# Patient Record
Sex: Female | Born: 1955 | Race: White | Hispanic: No | Smoking: Light tobacco smoker
Health system: Southern US, Community
[De-identification: ages and names within clinical notes are randomized; demographics above are authoritative.]

## PROBLEM LIST (undated history)

## (undated) DIAGNOSIS — E785 Hyperlipidemia, unspecified: Secondary | ICD-10-CM

## (undated) DIAGNOSIS — F419 Anxiety disorder, unspecified: Secondary | ICD-10-CM

## (undated) DIAGNOSIS — R7309 Other abnormal glucose: Secondary | ICD-10-CM

## (undated) DIAGNOSIS — O009 Unspecified ectopic pregnancy without intrauterine pregnancy: Secondary | ICD-10-CM

## (undated) DIAGNOSIS — C539 Malignant neoplasm of cervix uteri, unspecified: Secondary | ICD-10-CM

## (undated) DIAGNOSIS — L301 Dyshidrosis [pompholyx]: Secondary | ICD-10-CM

## (undated) DIAGNOSIS — Z720 Tobacco use: Secondary | ICD-10-CM

## (undated) DIAGNOSIS — M1990 Unspecified osteoarthritis, unspecified site: Secondary | ICD-10-CM

## (undated) DIAGNOSIS — I10 Essential (primary) hypertension: Secondary | ICD-10-CM

## (undated) DIAGNOSIS — L309 Dermatitis, unspecified: Secondary | ICD-10-CM

## (undated) DIAGNOSIS — IMO0002 Reserved for concepts with insufficient information to code with codable children: Secondary | ICD-10-CM

## (undated) HISTORY — DX: Essential (primary) hypertension: I10

## (undated) HISTORY — DX: Tobacco use: Z72.0

## (undated) HISTORY — DX: Unspecified osteoarthritis, unspecified site: M19.90

## (undated) HISTORY — DX: Reserved for concepts with insufficient information to code with codable children: IMO0002

## (undated) HISTORY — DX: Anxiety disorder, unspecified: F41.9

## (undated) HISTORY — DX: Dermatitis, unspecified: L30.9

## (undated) HISTORY — DX: Dyshidrosis (pompholyx): L30.1

## (undated) HISTORY — DX: Other abnormal glucose: R73.09

## (undated) HISTORY — DX: Hyperlipidemia, unspecified: E78.5

---

## 2003-03-30 ENCOUNTER — Emergency Department (HOSPITAL_COMMUNITY): Admission: AD | Admit: 2003-03-30 | Discharge: 2003-03-30 | Payer: Self-pay | Admitting: Internal Medicine

## 2005-02-08 ENCOUNTER — Emergency Department (HOSPITAL_COMMUNITY): Admission: EM | Admit: 2005-02-08 | Discharge: 2005-02-08 | Payer: Self-pay | Admitting: Emergency Medicine

## 2009-11-08 ENCOUNTER — Emergency Department (HOSPITAL_COMMUNITY): Admission: EM | Admit: 2009-11-08 | Discharge: 2009-11-08 | Payer: Self-pay | Admitting: Emergency Medicine

## 2009-12-04 ENCOUNTER — Encounter: Payer: Self-pay | Admitting: Licensed Clinical Social Worker

## 2009-12-04 ENCOUNTER — Ambulatory Visit: Payer: Self-pay | Admitting: Internal Medicine

## 2009-12-04 ENCOUNTER — Encounter: Payer: Self-pay | Admitting: Internal Medicine

## 2009-12-04 DIAGNOSIS — F17200 Nicotine dependence, unspecified, uncomplicated: Secondary | ICD-10-CM | POA: Insufficient documentation

## 2009-12-04 DIAGNOSIS — F419 Anxiety disorder, unspecified: Secondary | ICD-10-CM | POA: Insufficient documentation

## 2009-12-04 DIAGNOSIS — R7309 Other abnormal glucose: Secondary | ICD-10-CM

## 2009-12-04 DIAGNOSIS — T7491XA Unspecified adult maltreatment, confirmed, initial encounter: Secondary | ICD-10-CM | POA: Insufficient documentation

## 2009-12-04 DIAGNOSIS — L259 Unspecified contact dermatitis, unspecified cause: Secondary | ICD-10-CM | POA: Insufficient documentation

## 2009-12-04 DIAGNOSIS — L301 Dyshidrosis [pompholyx]: Secondary | ICD-10-CM

## 2009-12-04 LAB — CONVERTED CEMR LAB: Hgb A1c MFr Bld: 5.8 %

## 2009-12-05 ENCOUNTER — Telehealth: Payer: Self-pay | Admitting: Internal Medicine

## 2010-05-26 NOTE — Progress Notes (Signed)
Summary: med change/gp  Phone Note Refill Request Message from:  Fax from Pharmacy on December 05, 2009 11:59 AM  Refills Requested: Medication #1:  TRIAMCINOLONE ACETONIDE 0.1 % LOTN apply to affected areas of hands and feet twice daily Lotion is not available; can it be change to cream or lotion.  Need new Rx.  Thanks   Method Requested: Electronic Initial call taken by: Chinita Pester RN,  December 05, 2009 12:00 PM  Follow-up for Phone Call        done Follow-up by: Darnelle Maffucci MD,  December 05, 2009 1:45 PM  Additional Follow-up for Phone Call Additional follow up Details #1::        Dr. Gilford Rile can u sign the Rx.  Thanks Additional Follow-up by: Chinita Pester RN,  December 05, 2009 1:48 PM    New/Updated Medications: TRIAMCINOLONE ACETONIDE 0.1 % CREA (TRIAMCINOLONE ACETONIDE) apply to affected areas twice daily. Prescriptions: TRIAMCINOLONE ACETONIDE 0.1 % CREA (TRIAMCINOLONE ACETONIDE) apply to affected areas twice daily.  #1 x 2   Entered and Authorized by:   Darnelle Maffucci MD   Signed by:   Darnelle Maffucci MD on 12/05/2009   Method used:   Electronically to        [facility] [phone]* (retail)       [address]       [place], [place]  [address]       Ph: [phone]       Fax: [phone]   RxID:   [id number] TRIAMCINOLONE ACETONIDE 0.1 % CREA (TRIAMCINOLONE ACETONIDE) apply to affected areas twice daily.  #1 x 2   Entered and Authorized by:   Darnelle Maffucci MD   Signed by:   Chinita Pester RN on 12/05/2009   Method used:   Electronically to        [facility] [phone]* (retail)       [address]       [place], [place]  [address]       Ph: [phone]       Fax: [phone]   RxID:   [id number]

## 2010-05-26 NOTE — Miscellaneous (Signed)
Summary: PATIENT CONSENT FORM  PATIENT CONSENT FORM   Imported By: Louretta Parma 12/04/2009 10:38:08  _____________________________________________________________________  External Attachment:    Type:   Image     Comment:   External Document
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
## 2010-07-11 LAB — URINALYSIS, ROUTINE W REFLEX MICROSCOPIC
Glucose, UA: NEGATIVE mg/dL
Hgb urine dipstick: NEGATIVE
Ketones, ur: NEGATIVE mg/dL
Nitrite: NEGATIVE
Protein, ur: NEGATIVE mg/dL
Specific Gravity, Urine: 1.028 (ref 1.005–1.030)
Urobilinogen, UA: 0.2 mg/dL (ref 0.0–1.0)
pH: 5.5 (ref 5.0–8.0)

## 2010-07-11 LAB — COMPREHENSIVE METABOLIC PANEL
ALT: 44 U/L — ABNORMAL HIGH (ref 0–35)
AST: 36 U/L (ref 0–37)
Albumin: 4.2 g/dL (ref 3.5–5.2)
Alkaline Phosphatase: 106 U/L (ref 39–117)
BUN: 11 mg/dL (ref 6–23)
CO2: 27 mEq/L (ref 19–32)
Calcium: 9.9 mg/dL (ref 8.4–10.5)
Chloride: 105 mEq/L (ref 96–112)
Creatinine, Ser: 0.63 mg/dL (ref 0.4–1.2)
GFR calc Af Amer: >60 mL/min (ref >60)
GFR calc non Af Amer: >60 mL/min (ref >60)
Glucose, Bld: 131 mg/dL — ABNORMAL HIGH (ref 70–99)
Potassium: 4 mEq/L (ref 3.5–5.1)
Sodium: 141 mEq/L (ref 135–145)
Total Bilirubin: 0.7 mg/dL (ref 0.3–1.2)
Total Protein: 7.4 g/dL (ref 6.0–8.3)

## 2010-07-11 LAB — CBC
HCT: 46.9 % — ABNORMAL HIGH (ref 36.0–46.0)
Hemoglobin: 16.4 g/dL — ABNORMAL HIGH (ref 12.0–15.0)
MCH: 31.5 pg (ref 26.0–34.0)
MCHC: 34.8 g/dL (ref 30.0–36.0)
MCV: 90.6 fL (ref 78.0–100.0)
Platelets: 240 10*3/uL (ref 150–400)
RBC: 5.18 MIL/uL — ABNORMAL HIGH (ref 3.87–5.11)
RDW: 13.1 % (ref 11.5–15.5)
WBC: 9.1 10*3/uL (ref 4.0–10.5)

## 2010-07-11 LAB — TSH: TSH: 2 u[IU]/mL (ref 0.350–4.500)

## 2010-07-11 LAB — DIFFERENTIAL
Basophils Absolute: 0.1 10*3/uL (ref 0.0–0.1)
Basophils Relative: 2 % — ABNORMAL HIGH (ref 0–1)
Eosinophils Absolute: 0.3 10*3/uL (ref 0.0–0.7)
Eosinophils Relative: 3 % (ref 0–5)
Lymphocytes Relative: 29 % (ref 12–46)
Lymphs Abs: 2.6 10*3/uL (ref 0.7–4.0)
Monocytes Absolute: 0.6 10*3/uL (ref 0.1–1.0)
Monocytes Relative: 6 % (ref 3–12)
Neutro Abs: 5.5 10*3/uL (ref 1.7–7.7)
Neutrophils Relative %: 61 % (ref 43–77)

## 2010-07-11 LAB — URINE MICROSCOPIC-ADD ON

## 2010-07-23 ENCOUNTER — Encounter: Payer: Self-pay | Admitting: Internal Medicine

## 2010-12-11 ENCOUNTER — Inpatient Hospital Stay (INDEPENDENT_AMBULATORY_CARE_PROVIDER_SITE_OTHER)
Admission: RE | Admit: 2010-12-11 | Discharge: 2010-12-11 | Disposition: A | Discharge status: Home / Self Care | Payer: Self-pay | Source: Ambulatory Visit | Attending: Emergency Medicine | Admitting: Emergency Medicine

## 2010-12-11 DIAGNOSIS — L408 Other psoriasis: Secondary | ICD-10-CM

## 2010-12-13 LAB — WOUND CULTURE
Culture: NO GROWTH
Gram Stain: NONE SEEN

## 2010-12-14 LAB — HERPES SIMPLEX VIRUS CULTURE: Culture: NOT DETECTED

## 2013-01-23 ENCOUNTER — Encounter (HOSPITAL_COMMUNITY): Payer: Self-pay | Admitting: Emergency Medicine

## 2013-01-23 ENCOUNTER — Emergency Department (HOSPITAL_COMMUNITY)
Admission: EM | Admit: 2013-01-23 | Discharge: 2013-01-24 | Disposition: A | Discharge status: Home / Self Care | Payer: Self-pay | Attending: Emergency Medicine | Admitting: Emergency Medicine

## 2013-01-23 ENCOUNTER — Emergency Department (HOSPITAL_COMMUNITY): Payer: Self-pay

## 2013-01-23 DIAGNOSIS — IMO0002 Reserved for concepts with insufficient information to code with codable children: Secondary | ICD-10-CM | POA: Insufficient documentation

## 2013-01-23 DIAGNOSIS — F17200 Nicotine dependence, unspecified, uncomplicated: Secondary | ICD-10-CM | POA: Insufficient documentation

## 2013-01-23 DIAGNOSIS — Z872 Personal history of diseases of the skin and subcutaneous tissue: Secondary | ICD-10-CM | POA: Insufficient documentation

## 2013-01-23 DIAGNOSIS — J3489 Other specified disorders of nose and nasal sinuses: Secondary | ICD-10-CM | POA: Insufficient documentation

## 2013-01-23 DIAGNOSIS — Z8541 Personal history of malignant neoplasm of cervix uteri: Secondary | ICD-10-CM | POA: Insufficient documentation

## 2013-01-23 DIAGNOSIS — Z862 Personal history of diseases of the blood and blood-forming organs and certain disorders involving the immune mechanism: Secondary | ICD-10-CM | POA: Insufficient documentation

## 2013-01-23 DIAGNOSIS — J069 Acute upper respiratory infection, unspecified: Secondary | ICD-10-CM | POA: Insufficient documentation

## 2013-01-23 DIAGNOSIS — R61 Generalized hyperhidrosis: Secondary | ICD-10-CM | POA: Insufficient documentation

## 2013-01-23 DIAGNOSIS — Z8639 Personal history of other endocrine, nutritional and metabolic disease: Secondary | ICD-10-CM | POA: Insufficient documentation

## 2013-01-23 DIAGNOSIS — Z8659 Personal history of other mental and behavioral disorders: Secondary | ICD-10-CM | POA: Insufficient documentation

## 2013-01-23 DIAGNOSIS — Z8742 Personal history of other diseases of the female genital tract: Secondary | ICD-10-CM | POA: Insufficient documentation

## 2013-01-23 HISTORY — DX: Unspecified ectopic pregnancy without intrauterine pregnancy: O00.90

## 2013-01-23 HISTORY — DX: Malignant neoplasm of cervix uteri, unspecified: C53.9

## 2013-01-23 MED ORDER — ALBUTEROL SULFATE (5 MG/ML) 0.5% IN NEBU
5.0000 mg | INHALATION_SOLUTION | Freq: Once | RESPIRATORY_TRACT | Status: AC
  Administered 2013-01-23: 5 mg via RESPIRATORY_TRACT
  Filled 2013-01-23 (×2): qty 1

## 2013-01-23 NOTE — ED Notes (Signed)
NP at bedside.

## 2013-01-23 NOTE — ED Notes (Signed)
Pt. reports productive cough with nasal / chest  congestion  for several weeks unrelieved by OTC medications .

## 2013-01-23 NOTE — ED Notes (Signed)
RT at bedside.
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
## 2013-01-23 NOTE — ED Notes (Signed)
Pt. C/o productive cough x2 weeks. States that when she lays down to sleep "my throat closes up and I can't breathe".

## 2013-01-24 MED ORDER — GUAIFENESIN-CODEINE 100-10 MG/5ML PO SOLN: 5.0000 mL | Freq: Three times a day (TID) | ORAL | Status: DC | PRN

## 2013-01-24 MED ORDER — ALBUTEROL SULFATE HFA 108 (90 BASE) MCG/ACT IN AERS
2.0000 | INHALATION_SPRAY | RESPIRATORY_TRACT | Status: DC | PRN
  Administered 2013-01-24: 2 via RESPIRATORY_TRACT
  Filled 2013-01-24: qty 6.7

## 2013-01-24 MED ORDER — AEROCHAMBER PLUS W/MASK MISC
Status: AC
  Administered 2013-01-24
  Filled 2013-01-24: qty 1

## 2013-01-26 NOTE — ED Provider Notes (Signed)
Medical screening examination/treatment/procedure(s) were performed by non-physician practitioner and as supervising physician I was immediately available for consultation/collaboration.  Lynetta Tomczak T Isaac Lacson, MD 01/26/13 0709 

## 2013-05-23 ENCOUNTER — Emergency Department (INDEPENDENT_AMBULATORY_CARE_PROVIDER_SITE_OTHER): Payer: No Typology Code available for payment source

## 2013-05-23 ENCOUNTER — Encounter (HOSPITAL_COMMUNITY): Payer: Self-pay | Admitting: Emergency Medicine

## 2013-05-23 ENCOUNTER — Emergency Department (INDEPENDENT_AMBULATORY_CARE_PROVIDER_SITE_OTHER)
Admission: EM | Admit: 2013-05-23 | Discharge: 2013-05-23 | Disposition: A | Discharge status: Home / Self Care | Payer: No Typology Code available for payment source | Source: Home / Self Care | Attending: Family Medicine | Admitting: Family Medicine

## 2013-05-23 DIAGNOSIS — M7580 Other shoulder lesions, unspecified shoulder: Secondary | ICD-10-CM

## 2013-05-23 DIAGNOSIS — M5030 Other cervical disc degeneration, unspecified cervical region: Secondary | ICD-10-CM

## 2013-05-23 DIAGNOSIS — M25819 Other specified joint disorders, unspecified shoulder: Secondary | ICD-10-CM

## 2013-05-23 DIAGNOSIS — M7542 Impingement syndrome of left shoulder: Secondary | ICD-10-CM

## 2013-05-23 MED ORDER — TRAMADOL HCL 50 MG PO TABS: 50.0000 mg | ORAL_TABLET | Freq: Four times a day (QID) | ORAL | Status: DC | PRN

## 2013-05-23 MED ORDER — PREDNISONE 10 MG PO KIT: PACK | ORAL | Status: DC

## 2013-05-23 NOTE — ED Notes (Signed)
C/o left arm pain States she is unable to sleep, lift arm, and unable to turn head  States pain radiates to neck She thinks it could be arthritis but unsure
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
## 2013-05-23 NOTE — Discharge Instructions (Signed)
Thank you for coming in today. Take prednisone daily for 12 days. Use tramadol for severe pain. Please get your orange card. Followup at the [facility] [address] [place], [place] [address] [phone]  Please see Bonna Gains to qualify for reduced or free medical services within the [place].  Call her at [phone] today.  Do the shoulder exercises we talked about.  Come back or go to the emergency room if you notice new weakness new numbness problems walking or bowel or bladder problems.

## 2013-05-25 ENCOUNTER — Emergency Department (INDEPENDENT_AMBULATORY_CARE_PROVIDER_SITE_OTHER)
Admission: EM | Admit: 2013-05-25 | Discharge: 2013-05-25 | Disposition: A | Discharge status: Home / Self Care | Payer: No Typology Code available for payment source | Source: Home / Self Care | Attending: Family Medicine | Admitting: Family Medicine

## 2013-05-25 ENCOUNTER — Encounter (HOSPITAL_COMMUNITY): Payer: Self-pay | Admitting: Emergency Medicine

## 2013-05-25 DIAGNOSIS — M25819 Other specified joint disorders, unspecified shoulder: Secondary | ICD-10-CM

## 2013-05-25 DIAGNOSIS — M7542 Impingement syndrome of left shoulder: Secondary | ICD-10-CM

## 2013-05-25 DIAGNOSIS — M5030 Other cervical disc degeneration, unspecified cervical region: Secondary | ICD-10-CM

## 2013-05-25 DIAGNOSIS — M7580 Other shoulder lesions, unspecified shoulder: Secondary | ICD-10-CM

## 2013-05-25 MED ORDER — DICLOFENAC SODIUM 1 % TD GEL: TRANSDERMAL | Status: DC

## 2013-05-25 NOTE — Discharge Instructions (Signed)
You can try voltaren (diclofenac) gel for your shoulder pain We will put your shoulder in a sling that should help take pressure off the shoulder. Make sure to fill the prescription for prednisone if you are at all able.  Followup at the [facility]  [address] [place], [place] [address]  [phone]
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
## 2013-05-25 NOTE — ED Notes (Signed)
Patient has left shoulder pain, same pain seen for 1/28.  Patient reports the tramadol makes he have headaches, sweats, constipation, and heartburn.  Patient did not get prednisone filled-too expensive.

## 2013-05-27 NOTE — ED Provider Notes (Signed)
Medical screening examination/treatment/procedure(s) were performed by resident physician or non-physician practitioner and as supervising physician I was immediately available for consultation/collaboration.   Pauline Good MD.   Billy Fischer, MD 05/27/13 [phone]

## 2013-06-25 ENCOUNTER — Emergency Department (INDEPENDENT_AMBULATORY_CARE_PROVIDER_SITE_OTHER)
Admission: EM | Admit: 2013-06-25 | Discharge: 2013-06-25 | Disposition: A | Discharge status: Home / Self Care | Payer: No Typology Code available for payment source | Source: Home / Self Care | Attending: Family Medicine | Admitting: Family Medicine

## 2013-06-25 ENCOUNTER — Encounter (HOSPITAL_COMMUNITY): Payer: Self-pay | Admitting: Emergency Medicine

## 2013-06-25 DIAGNOSIS — M7550 Bursitis of unspecified shoulder: Secondary | ICD-10-CM

## 2013-06-25 DIAGNOSIS — M25519 Pain in unspecified shoulder: Secondary | ICD-10-CM

## 2013-06-25 DIAGNOSIS — M75100 Unspecified rotator cuff tear or rupture of unspecified shoulder, not specified as traumatic: Secondary | ICD-10-CM

## 2013-06-25 DIAGNOSIS — IMO0002 Reserved for concepts with insufficient information to code with codable children: Secondary | ICD-10-CM

## 2013-06-25 MED ORDER — TRAMADOL HCL 50 MG PO TABS: 50.0000 mg | ORAL_TABLET | Freq: Four times a day (QID) | ORAL | Status: DC | PRN

## 2013-06-25 MED ORDER — METHYLPREDNISOLONE ACETATE 40 MG/ML IJ SUSP
20.0000 mg | Freq: Once | INTRAMUSCULAR | Status: AC
  Administered 2013-06-25: 20 mg via INTRA_ARTICULAR

## 2013-06-25 MED ORDER — METHYLPREDNISOLONE ACETATE 40 MG/ML IJ SUSP
INTRAMUSCULAR | Status: AC
  Filled 2013-06-25: qty 5

## 2013-06-25 NOTE — ED Notes (Signed)
Pt  Reports  l   Shoulder  Pain          For  Quite  A  While     Reports  Was  Seen  ucc  3  Weeks  Ago  For  Rotator  Cuff        She  Reports  Pain is  Worse  On  Certain movements  And  posistions
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
## 2013-06-25 NOTE — Discharge Instructions (Signed)
Impingement Syndrome, Rotator Cuff, Bursitis with Rehab Impingement syndrome is a condition that involves inflammation of the tendons of the rotator cuff and the subacromial bursa, that causes pain in the shoulder. The rotator cuff consists of four tendons and muscles that control much of the shoulder and upper arm function. The subacromial bursa is a fluid filled sac that helps reduce friction between the rotator cuff and one of the bones of the shoulder (acromion). Impingement syndrome is usually an overuse injury that causes swelling of the bursa (bursitis), swelling of the tendon (tendonitis), and/or a tear of the tendon (strain). Strains are classified into three categories. Grade 1 strains cause pain, but the tendon is not lengthened. Grade 2 strains include a lengthened ligament, due to the ligament being stretched or partially ruptured. With grade 2 strains there is still function, although the function may be decreased. Grade 3 strains include a complete tear of the tendon or muscle, and function is usually impaired. SYMPTOMS   Pain around the shoulder, often at the outer portion of the upper arm.  Pain that gets worse with shoulder function, especially when reaching overhead or lifting.  Sometimes, aching when not using the arm.  Pain that wakes you up at night.  Sometimes, tenderness, swelling, warmth, or redness over the affected area.  Loss of strength.  Limited motion of the shoulder, especially reaching behind the back (to the back pocket or to unhook bra) or across your body.  Crackling sound (crepitation) when moving the arm.  Biceps tendon pain and inflammation (in the front of the shoulder). Worse when bending the elbow or lifting. CAUSES  Impingement syndrome is often an overuse injury, in which chronic (repetitive) motions cause the tendons or bursa to become inflamed. A strain occurs when a force is paced on the tendon or muscle that is greater than it can withstand.  Common mechanisms of injury include: Stress from sudden increase in duration, frequency, or intensity of training.  Direct hit (trauma) to the shoulder.  Aging, erosion of the tendon with normal use.  Bony bump on shoulder (acromial spur). RISK INCREASES WITH:  Contact sports (football, wrestling, boxing).  Throwing sports (baseball, tennis, volleyball).  Weightlifting and bodybuilding.  Heavy labor.  Previous injury to the rotator cuff, including impingement.  Poor shoulder strength and flexibility.  Failure to warm up properly before activity.  Inadequate protective equipment.  Old age.  Bony bump on shoulder (acromial spur). PREVENTION   Warm up and stretch properly before activity.  Allow for adequate recovery between workouts.  Maintain physical fitness:  Strength, flexibility, and endurance.  Cardiovascular fitness.  Learn and use proper exercise technique. PROGNOSIS  If treated properly, impingement syndrome usually goes away within 6 weeks. Sometimes surgery is required.  RELATED COMPLICATIONS   Longer healing time if not properly treated, or if not given enough time to heal.  Recurring symptoms, that result in a chronic condition.  Shoulder stiffness, frozen shoulder, or loss of motion.  Rotator cuff tendon tear.  Recurring symptoms, especially if activity is resumed too soon, with overuse, with a direct blow, or when using poor technique. TREATMENT  Treatment first involves the use of ice and medicine, to reduce pain and inflammation. The use of strengthening and stretching exercises may help reduce pain with activity. These exercises may be performed at home or with a therapist. If non-surgical treatment is unsuccessful after more than 6 months, surgery may be advised. After surgery and rehabilitation, activity is usually possible in 3 months.  MEDICATION  If pain medicine is needed, nonsteroidal anti-inflammatory medicines (aspirin and  ibuprofen), or other minor pain relievers (acetaminophen), are often advised.  Do not take pain medicine for 7 days before surgery.  Prescription pain relievers may be given, if your caregiver thinks they are needed. Use only as directed and only as much as you need.  Corticosteroid injections may be given by your caregiver. These injections should be reserved for the most serious cases, because they may only be given a certain number of times. HEAT AND COLD  Cold treatment (icing) should be applied for 10 to 15 minutes every 2 to 3 hours for inflammation and pain, and immediately after activity that aggravates your symptoms. Use ice packs or an ice massage.  Heat treatment may be used before performing stretching and strengthening activities prescribed by your caregiver, physical therapist, or athletic trainer. Use a heat pack or a warm water soak. SEEK MEDICAL CARE IF:   Symptoms get worse or do not improve in 4 to 6 weeks, despite treatment.  New, unexplained symptoms develop. (Drugs used in treatment may produce side effects.) EXERCISES  RANGE OF MOTION (ROM) AND STRETCHING EXERCISES - Impingement Syndrome (Rotator Cuff  Tendinitis, Bursitis) These exercises may help you when beginning to rehabilitate your injury. Your symptoms may go away with or without further involvement from your physician, physical therapist or athletic trainer. While completing these exercises, remember:   Restoring tissue flexibility helps normal motion to return to the joints. This allows healthier, less painful movement and activity.  An effective stretch should be held for at least 30 seconds.  A stretch should never be painful. You should only feel a gentle lengthening or release in the stretched tissue. STRETCH  Flexion, Standing  Stand with good posture. With an underhand grip on your right / left hand, and an overhand grip on the opposite hand, grasp a broomstick or cane so that your hands are a little  more than shoulder width apart.  Keeping your right / left elbow straight and shoulder muscles relaxed, push the stick with your opposite hand, to raise your right / left arm in front of your body and then overhead. Raise your arm until you feel a stretch in your right / left shoulder, but before you have increased shoulder pain.  Try to avoid shrugging your right / left shoulder as your arm rises, by keeping your shoulder blade tucked down and toward your mid-back spine. Hold for __________ seconds.  Slowly return to the starting position. Repeat __________ times. Complete this exercise __________ times per day. STRETCH  Abduction, Supine  Lie on your back. With an underhand grip on your right / left hand and an overhand grip on the opposite hand, grasp a broomstick or cane so that your hands are a little more than shoulder width apart.  Keeping your right / left elbow straight and your shoulder muscles relaxed, push the stick with your opposite hand, to raise your right / left arm out to the side of your body and then overhead. Raise your arm until you feel a stretch in your right / left shoulder, but before you have increased shoulder pain.  Try to avoid shrugging your right / left shoulder as your arm rises, by keeping your shoulder blade tucked down and toward your mid-back spine. Hold for __________ seconds.  Slowly return to the starting position. Repeat __________ times. Complete this exercise __________ times per day. ROM  Flexion, Active-Assisted  Lie on your back.  You may bend your knees for comfort.  Grasp a broomstick or cane so your hands are about shoulder width apart. Your right / left hand should grip the end of the stick, so that your hand is positioned "thumbs-up," as if you were about to shake hands.  Using your healthy arm to lead, raise your right / left arm overhead, until you feel a gentle stretch in your shoulder. Hold for __________ seconds.  Use the stick to  assist in returning your right / left arm to its starting position. Repeat __________ times. Complete this exercise __________ times per day.  ROM - Internal Rotation, Supine   Lie on your back on a firm surface. Place your right / left elbow about 60 degrees away from your side. Elevate your elbow with a folded towel, so that the elbow and shoulder are the same height.  Using a broomstick or cane and your strong arm, pull your right / left hand toward your body until you feel a gentle stretch, but no increase in your shoulder pain. Keep your shoulder and elbow in place throughout the exercise.  Hold for __________ seconds. Slowly return to the starting position. Repeat __________ times. Complete this exercise __________ times per day. STRETCH - Internal Rotation  Place your right / left hand behind your back, palm up.  Throw a towel or belt over your opposite shoulder. Grasp the towel with your right / left hand.  While keeping an upright posture, gently pull up on the towel, until you feel a stretch in the front of your right / left shoulder.  Avoid shrugging your right / left shoulder as your arm rises, by keeping your shoulder blade tucked down and toward your mid-back spine.  Hold for __________ seconds. Release the stretch, by lowering your healthy hand. Repeat __________ times. Complete this exercise __________ times per day. ROM - Internal Rotation   Using an underhand grip, grasp a stick behind your back with both hands.  While standing upright with good posture, slide the stick up your back until you feel a mild stretch in the front of your shoulder.  Hold for __________ seconds. Slowly return to your starting position. Repeat __________ times. Complete this exercise __________ times per day.  STRETCH  Posterior Shoulder Capsule   Stand or sit with good posture. Grasp your right / left elbow and draw it across your chest, keeping it at the same height as your  shoulder.  Pull your elbow, so your upper arm comes in closer to your chest. Pull until you feel a gentle stretch in the back of your shoulder.  Hold for __________ seconds. Repeat __________ times. Complete this exercise __________ times per day. STRENGTHENING EXERCISES - Impingement Syndrome (Rotator Cuff Tendinitis, Bursitis) These exercises may help you when beginning to rehabilitate your injury. They may resolve your symptoms with or without further involvement from your physician, physical therapist or athletic trainer. While completing these exercises, remember:  Muscles can gain both the endurance and the strength needed for everyday activities through controlled exercises.  Complete these exercises as instructed by your physician, physical therapist or athletic trainer. Increase the resistance and repetitions only as guided.  You may experience muscle soreness or fatigue, but the pain or discomfort you are trying to eliminate should never worsen during these exercises. If this pain does get worse, stop and make sure you are following the directions exactly. If the pain is still present after adjustments, discontinue the exercise until you can discuss  the trouble with your clinician.  During your recovery, avoid activity or exercises which involve actions that place your injured hand or elbow above your head or behind your back or head. These positions stress the tissues which you are trying to heal. STRENGTH - Scapular Depression and Adduction   With good posture, sit on a firm chair. Support your arms in front of you, with pillows, arm rests, or on a table top. Have your elbows in line with the sides of your body.  Gently draw your shoulder blades down and toward your mid-back spine. Gradually increase the tension, without tensing the muscles along the top of your shoulders and the back of your neck.  Hold for __________ seconds. Slowly release the tension and relax your muscles  completely before starting the next repetition.  After you have practiced this exercise, remove the arm support and complete the exercise in standing as well as sitting position. Repeat __________ times. Complete this exercise __________ times per day.  STRENGTH - Shoulder Abductors, Isometric  With good posture, stand or sit about 4-6 inches from a wall, with your right / left side facing the wall.  Bend your right / left elbow. Gently press your right / left elbow into the wall. Increase the pressure gradually, until you are pressing as hard as you can, without shrugging your shoulder or increasing any shoulder discomfort.  Hold for __________ seconds.  Release the tension slowly. Relax your shoulder muscles completely before you begin the next repetition. Repeat __________ times. Complete this exercise __________ times per day.  STRENGTH - External Rotators, Isometric  Keep your right / left elbow at your side and bend it 90 degrees.  Step into a door frame so that the outside of your right / left wrist can press against the door frame without your upper arm leaving your side.  Gently press your right / left wrist into the door frame, as if you were trying to swing the back of your hand away from your stomach. Gradually increase the tension, until you are pressing as hard as you can, without shrugging your shoulder or increasing any shoulder discomfort.  Hold for __________ seconds.  Release the tension slowly. Relax your shoulder muscles completely before you begin the next repetition. Repeat __________ times. Complete this exercise __________ times per day.  STRENGTH - Supraspinatus   Stand or sit with good posture. Grasp a __________ weight, or an exercise band or tubing, so that your hand is "thumbs-up," like you are shaking hands.  Slowly lift your right / left arm in a "V" away from your thigh, diagonally into the space between your side and straight ahead. Lift your hand to  shoulder height or as far as you can, without increasing any shoulder pain. At first, many people do not lift their hands above shoulder height.  Avoid shrugging your right / left shoulder as your arm rises, by keeping your shoulder blade tucked down and toward your mid-back spine.  Hold for __________ seconds. Control the descent of your hand, as you slowly return to your starting position. Repeat __________ times. Complete this exercise __________ times per day.  STRENGTH - External Rotators  Secure a rubber exercise band or tubing to a fixed object (table, pole) so that it is at the same height as your right / left elbow when you are standing or sitting on a firm surface.  Stand or sit so that the secured exercise band is at your uninjured side.  [employer]  your right / left elbow 90 degrees. Place a folded towel or small pillow under your right / left arm, so that your elbow is a few inches away from your side.  Keeping the tension on the exercise band, pull it away from your body, as if pivoting on your elbow. Be sure to keep your body steady, so that the movement is coming only from your rotating shoulder.  Hold for __________ seconds. Release the tension in a controlled manner, as you return to the starting position. Repeat __________ times. Complete this exercise __________ times per day.  STRENGTH - Internal Rotators   Secure a rubber exercise band or tubing to a fixed object (table, pole) so that it is at the same height as your right / left elbow when you are standing or sitting on a firm surface.  Stand or sit so that the secured exercise band is at your right / left side.  Bend your elbow 90 degrees. Place a folded towel or small pillow under your right / left arm so that your elbow is a few inches away from your side.  Keeping the tension on the exercise band, pull it across your body, toward your stomach. Be sure to keep your body steady, so that the movement is coming only from  your rotating shoulder.  Hold for __________ seconds. Release the tension in a controlled manner, as you return to the starting position. Repeat __________ times. Complete this exercise __________ times per day.  STRENGTH - Scapular Protractors, Standing   Stand arms length away from a wall. Place your hands on the wall, keeping your elbows straight.  Begin by dropping your shoulder blades down and toward your mid-back spine.  To strengthen your protractors, keep your shoulder blades down, but slide them forward on your rib cage. It will feel as if you are lifting the back of your rib cage away from the wall. This is a subtle motion and can be challenging to complete. Ask your caregiver for further instruction, if you are not sure you are doing the exercise correctly.  Hold for __________ seconds. Slowly return to the starting position, resting the muscles completely before starting the next repetition. Repeat __________ times. Complete this exercise __________ times per day. STRENGTH - Scapular Protractors, Supine  Lie on your back on a firm surface. Extend your right / left arm straight into the air while holding a __________ weight in your hand.  Keeping your head and back in place, lift your shoulder off the floor.  Hold for __________ seconds. Slowly return to the starting position, and allow your muscles to relax completely before starting the next repetition. Repeat __________ times. Complete this exercise __________ times per day. STRENGTH - Scapular Protractors, Quadruped  Get onto your hands and knees, with your shoulders directly over your hands (or as close as you can be, comfortably).  Keeping your elbows locked, lift the back of your rib cage up into your shoulder blades, so your mid-back rounds out. Keep your neck muscles relaxed.  Hold this position for __________ seconds. Slowly return to the starting position and allow your muscles to relax completely before starting the  next repetition. Repeat __________ times. Complete this exercise __________ times per day.  STRENGTH - Scapular Retractors  Secure a rubber exercise band or tubing to a fixed object (table, pole), so that it is at the height of your shoulders when you are either standing, or sitting on a firm armless chair.  With a  palm down grip, grasp an end of the band in each hand. Straighten your elbows and lift your hands straight in front of you, at shoulder height. Step back, away from the secured end of the band, until it becomes tense.  Squeezing your shoulder blades together, draw your elbows back toward your sides, as you bend them. Keep your upper arms lifted away from your body throughout the exercise.  Hold for __________ seconds. Slowly ease the tension on the band, as you reverse the directions and return to the starting position. Repeat __________ times. Complete this exercise __________ times per day. STRENGTH - Shoulder Extensors   Secure a rubber exercise band or tubing to a fixed object (table, pole) so that it is at the height of your shoulders when you are either standing, or sitting on a firm armless chair.  With a thumbs-up grip, grasp an end of the band in each hand. Straighten your elbows and lift your hands straight in front of you, at shoulder height. Step back, away from the secured end of the band, until it becomes tense.  Squeezing your shoulder blades together, pull your hands down to the sides of your thighs. Do not allow your hands to go behind you.  Hold for __________ seconds. Slowly ease the tension on the band, as you reverse the directions and return to the starting position. Repeat __________ times. Complete this exercise __________ times per day.  STRENGTH - Scapular Retractors and External Rotators   Secure a rubber exercise band or tubing to a fixed object (table, pole) so that it is at the height as your shoulders, when you are either standing, or sitting on a  firm armless chair.  With a palm down grip, grasp an end of the band in each hand. Bend your elbows 90 degrees and lift your elbows to shoulder height, at your sides. Step back, away from the secured end of the band, until it becomes tense.  Squeezing your shoulder blades together, rotate your shoulders so that your upper arms and elbows remain stationary, but your fists travel upward to head height.  Hold for __________ seconds. Slowly ease the tension on the band, as you reverse the directions and return to the starting position. Repeat __________ times. Complete this exercise __________ times per day.  STRENGTH - Scapular Retractors and External Rotators, Rowing   Secure a rubber exercise band or tubing to a fixed object (table, pole) so that it is at the height of your shoulders, when you are either standing, or sitting on a firm armless chair.  With a palm down grip, grasp an end of the band in each hand. Straighten your elbows and lift your hands straight in front of you, at shoulder height. Step back, away from the secured end of the band, until it becomes tense.  Step 1: Squeeze your shoulder blades together. Bending your elbows, draw your hands to your chest, as if you are rowing a boat. At the end of this motion, your hands and elbow should be at shoulder height and your elbows should be out to your sides.  Step 2: Rotate your shoulders, to raise your hands above your head. Your forearms should be vertical and your upper arms should be horizontal.  Hold for __________ seconds. Slowly ease the tension on the band, as you reverse the directions and return to the starting position. Repeat __________ times. Complete this exercise __________ times per day.  STRENGTH  Scapular Depressors  Find a sturdy chair  without wheels, such as a dining room chair.  Keeping your feet on the floor, and your hands on the chair arms, lift your bottom up from the seat, and lock your elbows.  Keeping  your elbows straight, allow gravity to pull your body weight down. Your shoulders will rise toward your ears.  Raise your body against gravity by drawing your shoulder blades down your back, shortening the distance between your shoulders and ears. Although your feet should always maintain contact with the floor, your feet should progressively support less body weight, as you get stronger.  Hold for __________ seconds. In a controlled and slow manner, lower your body weight to begin the next repetition. Repeat __________ times. Complete this exercise __________ times per day.  Document Released: 04/12/2005 Document Revised: 07/05/2011 Document Reviewed: 07/25/2008 [facility] SurgeryExitCare Patient Information 2014 [place]Care, [place].  Joint Injection Care After Refer to this sheet in the next few days. These instructions provide you with information on caring for yourself after you have had a joint injection. Your caregiver also may give you more specific instructions. Your treatment has been planned according to current medical practices, but problems sometimes occur. Call your caregiver if you have any problems or questions after your procedure. After any type of joint injection, it is not uncommon to experience:  Soreness, swelling, or bruising around the injection site.  Mild numbness, tingling, or weakness around the injection site caused by the numbing medicine used before or with the injection. It also is possible to experience the following effects associated with the specific agent after injection:  Iodine-based contrast agents:  Allergic reaction (itching, hives, widespread redness, and swelling beyond the injection site).  Corticosteroids (These effects are rare.):  Allergic reaction.  Increased blood sugar levels (If you have diabetes and you notice that your blood sugar levels have increased, notify your caregiver).  Increased blood pressure levels.  Mood swings.  Hyaluronic acid in the use  of viscosupplementation.  Temporary heat or redness.  Temporary rash and itching.  Increased fluid accumulation in the injected joint. These effects all should resolve within a day after your procedure.  HOME CARE INSTRUCTIONS  Limit yourself to light activity the day of your procedure. Avoid lifting heavy objects, bending, stooping, or twisting.  Take prescription or over-the-counter pain medication as directed by your caregiver.  You may apply ice to your injection site to reduce pain and swelling the day of your procedure. Ice may be applied 03-04 times:  Put ice in a plastic bag.  Place a towel between your skin and the bag.  Leave the ice on for no longer than 15-20 minutes each time. SEEK IMMEDIATE MEDICAL CARE IF:   Pain and swelling get worse rather than better or extend beyond the injection site.  Numbness does not go away.  Blood or fluid continues to leak from the injection site.  You have chest pain.  You have swelling of your face or tongue.  You have trouble breathing or you become dizzy.  You develop a fever, chills, or severe tenderness at the injection site that last longer than 1 day. MAKE SURE YOU:  Understand these instructions.  Watch your condition.  Get help right away if you are not doing well or if you get worse. Document Released: 12/24/2010 Document Revised: 07/05/2011 Document Reviewed: 12/24/2010 [facility] Patient Information 2014 [employer], [place].

## 2013-06-27 NOTE — ED Provider Notes (Signed)
Medical screening examination/treatment/procedure(s) were performed by a resident physician or non-physician practitioner and as the supervising physician I was immediately available for consultation/collaboration.  Marleen Moret, MD   Noa Constante S Terez Freimark, MD 06/27/13 0747 

## 2014-03-02 ENCOUNTER — Encounter (HOSPITAL_COMMUNITY): Payer: Self-pay | Admitting: *Deleted

## 2014-03-02 ENCOUNTER — Emergency Department (HOSPITAL_COMMUNITY)
Admission: EM | Admit: 2014-03-02 | Discharge: 2014-03-02 | Disposition: A | Discharge status: Home / Self Care | Payer: Self-pay | Attending: Emergency Medicine | Admitting: Emergency Medicine

## 2014-03-02 DIAGNOSIS — K047 Periapical abscess without sinus: Secondary | ICD-10-CM | POA: Insufficient documentation

## 2014-03-02 DIAGNOSIS — Z872 Personal history of diseases of the skin and subcutaneous tissue: Secondary | ICD-10-CM | POA: Insufficient documentation

## 2014-03-02 DIAGNOSIS — Z720 Tobacco use: Secondary | ICD-10-CM | POA: Insufficient documentation

## 2014-03-02 DIAGNOSIS — Z8541 Personal history of malignant neoplasm of cervix uteri: Secondary | ICD-10-CM | POA: Insufficient documentation

## 2014-03-02 DIAGNOSIS — R600 Localized edema: Secondary | ICD-10-CM | POA: Insufficient documentation

## 2014-03-02 DIAGNOSIS — F419 Anxiety disorder, unspecified: Secondary | ICD-10-CM | POA: Insufficient documentation

## 2014-03-02 DIAGNOSIS — Z880 Allergy status to penicillin: Secondary | ICD-10-CM | POA: Insufficient documentation

## 2014-03-02 MED ORDER — OXYCODONE-ACETAMINOPHEN 5-325 MG PO TABS: 1.0000 | ORAL_TABLET | Freq: Four times a day (QID) | ORAL | Status: DC | PRN

## 2014-03-02 MED ORDER — BUPIVACAINE HCL (PF) 0.25 % IJ SOLN
30.0000 mL | Freq: Once | INTRAMUSCULAR | Status: AC
  Administered 2014-03-02: 30 mL
  Filled 2014-03-02: qty 30

## 2014-03-02 MED ORDER — OXYCODONE-ACETAMINOPHEN 5-325 MG PO TABS
1.0000 | ORAL_TABLET | Freq: Once | ORAL | Status: AC
Start: 2014-03-02 — End: 2014-03-02
  Administered 2014-03-02: 1 via ORAL
  Filled 2014-03-02: qty 1

## 2014-03-02 MED ORDER — CLINDAMYCIN HCL 150 MG PO CAPS: 450.0000 mg | ORAL_CAPSULE | Freq: Three times a day (TID) | ORAL | Status: DC

## 2014-03-02 MED ORDER — BUPIVACAINE HCL 0.25 % IJ SOLN
5.0000 mL | Freq: Once | INTRAMUSCULAR | Status: DC
  Filled 2014-03-02: qty 5

## 2014-03-02 MED ORDER — OXYCODONE-ACETAMINOPHEN 5-325 MG PO TABS: 1.0000 | ORAL_TABLET | Freq: Once | ORAL | Status: DC

## 2014-03-02 MED ORDER — IBUPROFEN 800 MG PO TABS: 800.0000 mg | ORAL_TABLET | Freq: Three times a day (TID) | ORAL | Status: DC

## 2014-03-02 NOTE — Discharge Instructions (Signed)
Call a dentist for further evaluation of your dental pain and infection. Call for a follow up appointment with a Family or Primary Care Provider.  Return if Symptoms worsen.   Take medication as prescribed.  You have a dental injury. Use the resource guide listed below to help you find a dentist if you do not already have one to followup with. It is very important that you get evaluated by a dentist as soon as possible. Call tomorrow to schedule an appointment. Use your pain medication as prescribed and do not operate heavy machinery while on pain medication. Note that your pain medication contains acetaminophen (Tylenol) & its is not reccommended that you use additional acetaminophen (Tylenol) while taking this medication. Take your full course of antibiotics. Read the instructions below.  Eat a soft or liquid diet and rinse your mouth out after meals with warm water. You should see a dentist or return here at once if you have increased swelling, increased pain or uncontrolled bleeding from the site of your injury.   SEEK MEDICAL CARE IF:   You have increased pain not controlled with medicines.   You have swelling around your tooth, in your face or neck.   You have bleeding which starts, continues, or gets worse.   You have a fever >101  If you are unable to open your mouth  RESOURCE GUIDE  Dental Problems  Patients with Medicaid: [address] [facility] Phone:  [phone]                                                  Phone:  [phone]  If unable to pay or uninsured, contact:  [facility] or [facility] to become qualified for the adult dental clinic.  Chronic Pain Problems Contact [facility]  [phone] Patients need to be referred by their primary care doctor.  Insufficient Money for Medicine Contact [employer]:  call  "211" or [place] [phone].  No Primary Care Doctor Call [facility]  [phone] Other agencies that provide inexpensive medical care    [place]  [phone]    [facility]  [place]  [phone]    [facility]  [phone]    [facility]  [place]  [place]  [phone] [place]   [phone] (emergency services [phone])  Substance Abuse [facility]  [phone] [facility] [phone] [facility] [place] [phone] [facility] P[facility] [phone] Residential & Outpatient Substance Abuse Program  [phone]  Abuse/Neglect [place] [phone] [place] [phone] (After Hours)  Emergency [place] [phone]  [facility] [phone] [place] [phone]  MRSA Hotline #:  [facility] Dept. [address]      [place] Phone:  [phone]                                   Phone:  [phone]                 Phone:  [place] Phone:  [place] [phone] (After Hours)

## 2014-03-02 NOTE — ED Notes (Signed)
Pt has swelling to Rt side of face from dental abcess ,Pain 10/10

## 2014-03-02 NOTE — ED Notes (Signed)
Declined W/C at D/C and was escorted to lobby by RN. 
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
## 2014-03-04 NOTE — Care Management Note (Signed)
    Page 1 of 1   03/02/2014     9:42:33 AM CARE MANAGEMENT NOTE 03/02/2014  Patient:  Felicia Medina,Felicia Medina   Account Number:  [id number]  Date Initiated:  03/02/2014  Documentation initiated by:  [facility]  Subjective/Objective Assessment:   ED     Action/Plan:   med asst   Anticipated DC Date:     Anticipated DC Plan:           Choice offered to / List presented to:             Status of service:  Completed, signed off Medicare Important Message given?   (If response is "NO", the following Medicare IM given date fields will be blank) Date Medicare IM given:   Medicare IM given by:   Date Additional Medicare IM given:   Additional Medicare IM given by:    Discharge Disposition:  HOME/SELF CARE  Per UR Regulation:    If discussed at Long Length of Stay Meetings, dates discussed:    Comments:  03/02/14 09:15 CM received call from [place], Frankey Poot for [facility] for pt.  CM gave [facility] letter with list of participating pharmacies.  Pt verbalized understanding of MATCH parameters.  CM also gave pt [facility] pamphlet and pt verbalized understanding she will go to the clinic and ask for: AN APPOINTMENT FOR A PCP; AN APPOINTMENT TO MEEDT WITH A NAVIGATOR TO SECURE INSURANCE THRU AFFORDABLE CARE; AN APPOINTMENT FOR FOLLOW UP MEDICAL CARE.  CM called CSW to arrange transportation home for pt.  No other CM needs were communicated.  Mariane Masters, BSN, CM [phone].

## 2014-03-18 ENCOUNTER — Ambulatory Visit: Payer: Self-pay | Admitting: Family Medicine

## 2014-04-09 ENCOUNTER — Ambulatory Visit: Payer: Self-pay

## 2014-06-28 ENCOUNTER — Emergency Department (HOSPITAL_COMMUNITY)
Admission: EM | Admit: 2014-06-28 | Discharge: 2014-06-28 | Disposition: A | Discharge status: Home / Self Care | Payer: Self-pay | Attending: Emergency Medicine | Admitting: Emergency Medicine

## 2014-06-28 ENCOUNTER — Encounter (HOSPITAL_COMMUNITY): Payer: Self-pay | Admitting: Emergency Medicine

## 2014-06-28 DIAGNOSIS — F419 Anxiety disorder, unspecified: Secondary | ICD-10-CM

## 2014-06-28 DIAGNOSIS — Z720 Tobacco use: Secondary | ICD-10-CM | POA: Insufficient documentation

## 2014-06-28 DIAGNOSIS — Z791 Long term (current) use of non-steroidal anti-inflammatories (NSAID): Secondary | ICD-10-CM | POA: Insufficient documentation

## 2014-06-28 DIAGNOSIS — Z79899 Other long term (current) drug therapy: Secondary | ICD-10-CM | POA: Insufficient documentation

## 2014-06-28 DIAGNOSIS — Z792 Long term (current) use of antibiotics: Secondary | ICD-10-CM | POA: Insufficient documentation

## 2014-06-28 DIAGNOSIS — Z872 Personal history of diseases of the skin and subcutaneous tissue: Secondary | ICD-10-CM | POA: Insufficient documentation

## 2014-06-28 DIAGNOSIS — Z8541 Personal history of malignant neoplasm of cervix uteri: Secondary | ICD-10-CM | POA: Insufficient documentation

## 2014-06-28 DIAGNOSIS — Z760 Encounter for issue of repeat prescription: Secondary | ICD-10-CM

## 2014-06-28 DIAGNOSIS — M1990 Unspecified osteoarthritis, unspecified site: Secondary | ICD-10-CM

## 2014-06-28 DIAGNOSIS — Z880 Allergy status to penicillin: Secondary | ICD-10-CM | POA: Insufficient documentation

## 2014-06-28 MED ORDER — HYDROXYZINE HCL 25 MG PO TABS: 25.0000 mg | ORAL_TABLET | Freq: Four times a day (QID) | ORAL | Status: DC | PRN

## 2014-06-28 MED ORDER — LISINOPRIL-HYDROCHLOROTHIAZIDE 10-12.5 MG PO TABS: 1.0000 | ORAL_TABLET | Freq: Every day | ORAL | Status: DC

## 2014-06-28 MED ORDER — AMLODIPINE BESYLATE 5 MG PO TABS: 5.0000 mg | ORAL_TABLET | Freq: Every day | ORAL | Status: DC

## 2014-06-28 NOTE — ED Notes (Signed)
Pt sts need htn meds to be refilled and having increased stress and needs meds for chronic pain
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
## 2014-06-28 NOTE — Discharge Instructions (Signed)
Please take Vistaril as needed for your anxiety.  Follow up with your doctor for further management of your medical condition.  Take blood pressure medication as prescribed.

## 2014-06-28 NOTE — ED Notes (Signed)
Pt anxious at this time. States her sisters funeral is tomorrow and she is in a lot of pain and her "bp is up" feels like "she is just coming apart." States her arthritis is "acting up"

## 2014-07-15 ENCOUNTER — Ambulatory Visit: Payer: Self-pay | Attending: Internal Medicine

## 2014-07-22 ENCOUNTER — Ambulatory Visit: Payer: Self-pay | Attending: Family Medicine | Admitting: Family Medicine

## 2014-07-22 ENCOUNTER — Encounter: Payer: Self-pay | Admitting: Family Medicine

## 2014-07-22 VITALS — BP 119/75 | HR 73 | Temp 98.5°F | Resp 16 | Ht 63.5 in | Wt 162.0 lb

## 2014-07-22 DIAGNOSIS — Z114 Encounter for screening for human immunodeficiency virus [HIV]: Secondary | ICD-10-CM

## 2014-07-22 DIAGNOSIS — F17200 Nicotine dependence, unspecified, uncomplicated: Secondary | ICD-10-CM

## 2014-07-22 DIAGNOSIS — I10 Essential (primary) hypertension: Secondary | ICD-10-CM

## 2014-07-22 DIAGNOSIS — E785 Hyperlipidemia, unspecified: Secondary | ICD-10-CM | POA: Insufficient documentation

## 2014-07-22 DIAGNOSIS — Z720 Tobacco use: Secondary | ICD-10-CM

## 2014-07-22 DIAGNOSIS — G894 Chronic pain syndrome: Secondary | ICD-10-CM

## 2014-07-22 DIAGNOSIS — M1990 Unspecified osteoarthritis, unspecified site: Secondary | ICD-10-CM

## 2014-07-22 DIAGNOSIS — E559 Vitamin D deficiency, unspecified: Secondary | ICD-10-CM

## 2014-07-22 LAB — COMPLETE METABOLIC PANEL WITH GFR
ALT: 18 U/L (ref 0–35)
AST: 15 U/L (ref 0–37)
Albumin: 4.4 g/dL (ref 3.5–5.2)
Alkaline Phosphatase: 66 U/L (ref 39–117)
BILIRUBIN TOTAL: 0.3 mg/dL (ref 0.2–1.2)
BUN: 15 mg/dL (ref 6–23)
CO2: 29 meq/L (ref 19–32)
Calcium: 10.4 mg/dL (ref 8.4–10.5)
Chloride: 98 mEq/L (ref 96–112)
Creat: 0.8 mg/dL (ref 0.50–1.10)
GFR, Est Non African American: 80 mL/min
Glucose, Bld: 99 mg/dL (ref 70–99)
Potassium: 4 mEq/L (ref 3.5–5.3)
SODIUM: 139 meq/L (ref 135–145)
Total Protein: 7 g/dL (ref 6.0–8.3)

## 2014-07-22 LAB — CBC
HEMATOCRIT: 45.5 % (ref 36.0–46.0)
Hemoglobin: 15.7 g/dL — ABNORMAL HIGH (ref 12.0–15.0)
MCH: 29.6 pg (ref 26.0–34.0)
MCHC: 34.5 g/dL (ref 30.0–36.0)
MCV: 85.7 fL (ref 78.0–100.0)
MPV: 9.7 fL (ref 8.6–12.4)
PLATELETS: 294 10*3/uL (ref 150–400)
RBC: 5.31 MIL/uL — ABNORMAL HIGH (ref 3.87–5.11)
RDW: 13.5 % (ref 11.5–15.5)
WBC: 8.4 10*3/uL (ref 4.0–10.5)

## 2014-07-22 LAB — LIPID PANEL
Cholesterol: 226 mg/dL — ABNORMAL HIGH (ref 0–200)
HDL: 27 mg/dL — ABNORMAL LOW (ref >=46)
LDL Cholesterol: 133 mg/dL — ABNORMAL HIGH (ref 0–99)
Total CHOL/HDL Ratio: 8.4 Ratio
Triglycerides: 332 mg/dL — ABNORMAL HIGH (ref <150)
VLDL: 66 mg/dL — ABNORMAL HIGH (ref 0–40)

## 2014-07-22 LAB — RHEUMATOID FACTOR: Rhuematoid fact SerPl-aCnc: <10 IU/mL (ref <=14)

## 2014-07-22 MED ORDER — DULOXETINE HCL 30 MG PO CPEP: 30.0000 mg | ORAL_CAPSULE | Freq: Every day | ORAL | Status: DC

## 2014-07-22 MED ORDER — AMLODIPINE BESYLATE 10 MG PO TABS: 10.0000 mg | ORAL_TABLET | Freq: Every day | ORAL | Status: DC

## 2014-07-22 MED ORDER — DICLOFENAC SODIUM 75 MG PO TBEC: 75.0000 mg | DELAYED_RELEASE_TABLET | Freq: Two times a day (BID) | ORAL | Status: DC

## 2014-07-22 NOTE — Progress Notes (Signed)
   Subjective:    Patient ID: Felicia Medina, female    DOB: 22-Feb-1956, 59 y.o.   MRN: [id number]  HPI  1. Arthritis: x 2 years. Joint pain and swelling in hands, feet, ankles. No rash. No hx of RA.   2. HTN: dx in 2014. Taking norvasc 10 mg daily and prinzide. Smoking.   3. HLD: dx in 2014. Was on simvastatin. No muscle aches.     Med Hx: dx in 2014 Soc Hx: current smoker  Surg Hx: negative  Review of Systems  Respiratory: Negative for shortness of breath.   Cardiovascular: Negative for chest pain and palpitations.  Musculoskeletal: Positive for myalgias, back pain, joint swelling and arthralgias.  Psychiatric/Behavioral: Positive for dysphoric mood. The patient is nervous/anxious.        Objective:   Physical Exam BP 119/75 mmHg  Pulse 73  Temp(Src) 98.5 F (36.9 C) (Oral)  Resp 16  Ht 5' 3.5" (1.613 m)  Wt 162 lb (73.483 kg)  BMI 28.24 kg/m2  SpO2 95% General appearance: alert, cooperative and no distress Lungs: clear to auscultation bilaterally Heart: regular rate and rhythm, S1, S2 normal, no murmur, click, rub or gallop Extremities: extremities normal, atraumatic, no cyanosis or edema  Skin: normal w/o rash       Assessment & Plan:

## 2014-07-22 NOTE — Assessment & Plan Note (Signed)
A: BP well controlled Med: compliant P: continue current regimen  

## 2014-07-22 NOTE — Patient Instructions (Addendum)
Mrs. Westra,  Thank you for coming in today. It was a pleasure meeting you. I look forward to being your primary doctor.  1. HTN: BP at goal, very well controlled  Continue norvasc increase to 10 mg daily  Stop prinzide as you do not need two BP meds at this time  2. Chronic pain: start Cymbalta 30 mg daily   3. Arthritis: evaluating for rheumatoid vs. Osteoarthritis: Diclofenac 75 mg twice daily   4. Smoking cessation support: smoking cessation hotline: 1-800-QUIT-NOW.  Smoking cessation classes are available through [facility]. Call [phone] or visit our website at [email or web address]  5. High cholesterol: checking cholesterol today.   F/u in 4 weeks for pap smear   Dr. Adrian Blackwater

## 2014-07-22 NOTE — Progress Notes (Signed)
Establish Care Complaining of pain on him  Hx HTN and cholesterol  Requesting Medicine refills Tobacco user- 3-4 cigarettes per day

## 2014-07-23 LAB — SEDIMENTATION RATE: Sed Rate: 4 mm/hr (ref 0–30)

## 2014-07-23 LAB — ANA: Anti Nuclear Antibody(ANA): NEGATIVE

## 2014-07-23 LAB — VITAMIN D 25 HYDROXY (VIT D DEFICIENCY, FRACTURES): Vit D, 25-Hydroxy: 14 ng/mL — ABNORMAL LOW (ref 30–100)

## 2014-07-23 LAB — HIV ANTIBODY (ROUTINE TESTING W REFLEX): HIV: NONREACTIVE

## 2014-07-23 NOTE — Assessment & Plan Note (Addendum)
Arthritis: evaluating for rheumatoid vs. Osteoarthritis: Diclofenac 75 mg twice daily    Normal rheumatology studies, no lupus or rheumatoid arthritis, osteoarthritis

## 2014-07-23 NOTE — Assessment & Plan Note (Signed)
Chronic pain: start Cymbalta 30 mg daily

## 2014-07-23 NOTE — Assessment & Plan Note (Signed)
Smoking cessation support: smoking cessation hotline: 1-800-QUIT-NOW.  Smoking cessation classes are available through [facility]. Call [phone] or visit our website at [email or web address].   

## 2014-07-23 NOTE — Assessment & Plan Note (Signed)
Screening HIV ordered  

## 2014-07-23 NOTE — Assessment & Plan Note (Addendum)
High cholesterol, recommend lipitor 20 mg daily and repeat LDL in 6 months

## 2014-07-25 DIAGNOSIS — E559 Vitamin D deficiency, unspecified: Secondary | ICD-10-CM | POA: Insufficient documentation

## 2014-07-25 MED ORDER — VITAMIN D (ERGOCALCIFEROL) 1.25 MG (50000 UNIT) PO CAPS: 50000.0000 [IU] | ORAL_CAPSULE | ORAL | Status: DC

## 2014-07-25 MED ORDER — ATORVASTATIN CALCIUM 20 MG PO TABS: 20.0000 mg | ORAL_TABLET | Freq: Every day | ORAL | Status: DC

## 2014-07-25 NOTE — Addendum Note (Signed)
Addended by: Boykin Nearing on: 07/25/2014 03:49 PM   Modules accepted: Orders

## 2014-07-25 NOTE — Assessment & Plan Note (Signed)
A; vit D def P: vit D replacement

## 2014-08-19 ENCOUNTER — Encounter: Payer: Self-pay | Admitting: Family Medicine

## 2014-08-19 ENCOUNTER — Ambulatory Visit: Payer: Medicaid Other | Attending: Family Medicine | Admitting: Family Medicine

## 2014-08-19 DIAGNOSIS — G894 Chronic pain syndrome: Secondary | ICD-10-CM

## 2014-08-19 DIAGNOSIS — I10 Essential (primary) hypertension: Secondary | ICD-10-CM

## 2014-08-19 MED ORDER — ACETAMINOPHEN-CODEINE #3 300-30 MG PO TABS: 1.0000 | ORAL_TABLET | ORAL | Status: DC | PRN

## 2014-08-19 MED ORDER — LISINOPRIL 10 MG PO TABS: 10.0000 mg | ORAL_TABLET | Freq: Every day | ORAL | Status: DC

## 2014-08-19 NOTE — Progress Notes (Signed)
   Subjective:    Patient ID: Felicia Medina, female    DOB: Aug 05, 1955, 59 y.o.   MRN: [id number] CC: HTN, f/u anxiety   HPI  1. CHRONIC HYPERTENSION  Disease Monitoring  Blood pressure range: not checking   Chest pain: no   Dyspnea: yes, mild, smoking    Claudication: no   Medication compliance: yes  Medication Side Effects  Lightheadedness: no   Urinary frequency: no   Edema: no    2. Chronic pain: in neck, shoulders, ankles, wrist. No improvement with diclofenac and cymbalta. Associated with anxiety, depressed mood, insomnia.   Soc Hx: current smoker   Review of Systems  Musculoskeletal: Positive for arthralgias and neck pain.  Psychiatric/Behavioral: Positive for sleep disturbance and dysphoric mood. The patient is nervous/anxious.        Objective:   Physical Exam BP 174/92 mmHg  Pulse 68  Temp(Src) 98.6 F (37 C) (Oral)  Resp 16  Ht 5\' 3"  (1.6 m)  Wt 161 lb (73.029 kg)  BMI 28.53 kg/m2  SpO2 95% General appearance: alert, cooperative and no distress, tearful during exam  Lungs: clear to auscultation bilaterally Heart: regular rate and rhythm, S1, S2 normal, no murmur, click, rub or gallop Extremities: extremities normal, atraumatic, no cyanosis or edema     Assessment & Plan:

## 2014-08-19 NOTE — Patient Instructions (Addendum)
Felicia Medina,   1. Chronic pain associated with anxiety and insomnia: Add tylenol #3 Referral to pain management Continue cymbalta for 2 more weeks if no benefit still taper off my taking 1 pill every other day for 1 5 days,  then every 2 days for 5 days  2. HTN:  BP above goal Continue Norvasc 10 mg daily  Lisinopril 10 mg daily added back   F/u with RN in 4 weeks for BP check  See me in 3 months   Dr. Adrian Blackwater

## 2014-08-19 NOTE — Assessment & Plan Note (Signed)
A: HTN BP above goal Med: compliant with norvasc P: Add lisinopril 10 mg daily

## 2014-08-19 NOTE — Assessment & Plan Note (Signed)
A: worsening pain with swelling. Patient reports no improvement with cymbalta or diclofenac. Refusing steroids P: Tylenol #3 added Continue cymbalta as it has not yet been 6 weeks of therapy.  Referral to pain management placed Negative inflammatory markers

## 2014-08-19 NOTE — Progress Notes (Signed)
Stated medication not working. Arthritis medication not working. Hand and feet swelling

## 2014-08-28 ENCOUNTER — Telehealth: Payer: Self-pay | Admitting: General Practice

## 2014-08-28 NOTE — Telephone Encounter (Signed)
Patient presents to clinic to speak to nurse regarding lab results from prior visit. Informed patient that I could have a nurse share this information with her, patient states someone drove her to clinic and she didn't wan to inconvienence them by having them wait. Patient would just like a call back.  Please assist

## 2014-08-28 NOTE — Telephone Encounter (Signed)
[name] please call

## 2014-08-28 NOTE — Telephone Encounter (Signed)
Called patient to see if I could assist her.  She is wanting results of her blood work.

## 2014-09-11 ENCOUNTER — Telehealth: Payer: Self-pay | Admitting: Family Medicine

## 2014-09-11 NOTE — Telephone Encounter (Signed)
The referral was sent to [facility] because patient don't have insurance  They take longer to review patients chart to see if is appropiate to the clinic .

## 2014-09-11 NOTE — Telephone Encounter (Signed)
Pt called requesting information on pain clinic referral, please f/u with patient

## 2014-10-01 NOTE — Telephone Encounter (Signed)
Attempted to call patient to see if she still had a ? About her labs. Patient not available.

## 2014-10-02 ENCOUNTER — Telehealth: Payer: Self-pay | Admitting: Family Medicine

## 2014-10-02 NOTE — Telephone Encounter (Signed)
Patient has called up here today to request that she be called about her lab results; patient is unaware of her cholesterol levels; please f/u

## 2014-10-02 NOTE — Telephone Encounter (Signed)
Called back to patient. Gave lab results and care plan. Vit D x 8 weeks lipitor 20 mg daily, recheck in 6 months.

## 2014-10-06 ENCOUNTER — Emergency Department (HOSPITAL_COMMUNITY)
Admission: EM | Admit: 2014-10-06 | Discharge: 2014-10-06 | Disposition: A | Discharge status: Home / Self Care | Payer: Medicaid Other | Attending: Emergency Medicine | Admitting: Emergency Medicine

## 2014-10-06 ENCOUNTER — Encounter (HOSPITAL_COMMUNITY): Payer: Self-pay | Admitting: Emergency Medicine

## 2014-10-06 DIAGNOSIS — R600 Localized edema: Secondary | ICD-10-CM | POA: Diagnosis not present

## 2014-10-06 DIAGNOSIS — M79672 Pain in left foot: Secondary | ICD-10-CM | POA: Diagnosis not present

## 2014-10-06 DIAGNOSIS — Z880 Allergy status to penicillin: Secondary | ICD-10-CM | POA: Insufficient documentation

## 2014-10-06 DIAGNOSIS — Z720 Tobacco use: Secondary | ICD-10-CM | POA: Insufficient documentation

## 2014-10-06 DIAGNOSIS — N390 Urinary tract infection, site not specified: Secondary | ICD-10-CM | POA: Diagnosis not present

## 2014-10-06 DIAGNOSIS — M1990 Unspecified osteoarthritis, unspecified site: Secondary | ICD-10-CM | POA: Diagnosis not present

## 2014-10-06 DIAGNOSIS — Z79899 Other long term (current) drug therapy: Secondary | ICD-10-CM | POA: Diagnosis not present

## 2014-10-06 DIAGNOSIS — R609 Edema, unspecified: Secondary | ICD-10-CM

## 2014-10-06 DIAGNOSIS — I10 Essential (primary) hypertension: Secondary | ICD-10-CM | POA: Diagnosis not present

## 2014-10-06 DIAGNOSIS — E785 Hyperlipidemia, unspecified: Secondary | ICD-10-CM | POA: Insufficient documentation

## 2014-10-06 DIAGNOSIS — M79671 Pain in right foot: Secondary | ICD-10-CM | POA: Insufficient documentation

## 2014-10-06 DIAGNOSIS — Z791 Long term (current) use of non-steroidal anti-inflammatories (NSAID): Secondary | ICD-10-CM | POA: Insufficient documentation

## 2014-10-06 DIAGNOSIS — M79673 Pain in unspecified foot: Secondary | ICD-10-CM | POA: Diagnosis present

## 2014-10-06 DIAGNOSIS — Z8541 Personal history of malignant neoplasm of cervix uteri: Secondary | ICD-10-CM | POA: Diagnosis not present

## 2014-10-06 DIAGNOSIS — F419 Anxiety disorder, unspecified: Secondary | ICD-10-CM | POA: Diagnosis not present

## 2014-10-06 DIAGNOSIS — Z872 Personal history of diseases of the skin and subcutaneous tissue: Secondary | ICD-10-CM | POA: Diagnosis not present

## 2014-10-06 LAB — CBC WITH DIFFERENTIAL/PLATELET
BASOS ABS: 0.1 10*3/uL (ref 0.0–0.1)
BASOS PCT: 1 % (ref 0–1)
EOS ABS: 0.2 10*3/uL (ref 0.0–0.7)
Eosinophils Relative: 3 % (ref 0–5)
HCT: 41.2 % (ref 36.0–46.0)
HEMOGLOBIN: 13.7 g/dL (ref 12.0–15.0)
LYMPHS PCT: 36 % (ref 12–46)
Lymphs Abs: 2.4 10*3/uL (ref 0.7–4.0)
MCH: 29.5 pg (ref 26.0–34.0)
MCHC: 33.3 g/dL (ref 30.0–36.0)
MCV: 88.6 fL (ref 78.0–100.0)
MONOS PCT: 5 % (ref 3–12)
Monocytes Absolute: 0.3 10*3/uL (ref 0.1–1.0)
Neutro Abs: 3.7 10*3/uL (ref 1.7–7.7)
Neutrophils Relative %: 55 % (ref 43–77)
PLATELETS: 232 10*3/uL (ref 150–400)
RBC: 4.65 MIL/uL (ref 3.87–5.11)
RDW: 13 % (ref 11.5–15.5)
WBC: 6.7 10*3/uL (ref 4.0–10.5)

## 2014-10-06 LAB — URINALYSIS, ROUTINE W REFLEX MICROSCOPIC
Bilirubin Urine: NEGATIVE
GLUCOSE, UA: NEGATIVE mg/dL
Hgb urine dipstick: NEGATIVE
KETONES UR: NEGATIVE mg/dL
Nitrite: POSITIVE — AB
PH: 5 (ref 5.0–8.0)
PROTEIN: NEGATIVE mg/dL
SPECIFIC GRAVITY, URINE: 1.02 (ref 1.005–1.030)
Urobilinogen, UA: 0.2 mg/dL (ref 0.0–1.0)

## 2014-10-06 LAB — COMPREHENSIVE METABOLIC PANEL
ALT: 18 U/L (ref 14–54)
AST: 18 U/L (ref 15–41)
Albumin: 3.8 g/dL (ref 3.5–5.0)
Alkaline Phosphatase: 63 U/L (ref 38–126)
Anion gap: 7 (ref 5–15)
BILIRUBIN TOTAL: 0.2 mg/dL — AB (ref 0.3–1.2)
BUN: 10 mg/dL (ref 6–20)
CALCIUM: 9.1 mg/dL (ref 8.9–10.3)
CO2: 31 mmol/L (ref 22–32)
CREATININE: 0.76 mg/dL (ref 0.44–1.00)
Chloride: 105 mmol/L (ref 101–111)
GFR calc Af Amer: >60 mL/min (ref >60)
GFR calc non Af Amer: >60 mL/min (ref >60)
GLUCOSE: 89 mg/dL (ref 65–99)
Potassium: 3.8 mmol/L (ref 3.5–5.1)
SODIUM: 143 mmol/L (ref 135–145)
Total Protein: 6.2 g/dL — ABNORMAL LOW (ref 6.5–8.1)

## 2014-10-06 LAB — URINE MICROSCOPIC-ADD ON

## 2014-10-06 LAB — CK: Total CK: 64 U/L (ref 38–234)

## 2014-10-06 MED ORDER — OXYCODONE-ACETAMINOPHEN 5-325 MG PO TABS
1.0000 | ORAL_TABLET | Freq: Once | ORAL | Status: DC
  Filled 2014-10-06: qty 1

## 2014-10-06 MED ORDER — CEPHALEXIN 500 MG PO CAPS: 500.0000 mg | ORAL_CAPSULE | Freq: Four times a day (QID) | ORAL | Status: DC

## 2014-10-06 MED ORDER — CEPHALEXIN 250 MG PO CAPS
500.0000 mg | ORAL_CAPSULE | Freq: Once | ORAL | Status: AC
  Administered 2014-10-06: 500 mg via ORAL
  Filled 2014-10-06: qty 2

## 2014-10-06 MED ORDER — OXYCODONE-ACETAMINOPHEN 5-325 MG PO TABS
1.0000 | ORAL_TABLET | Freq: Once | ORAL | Status: AC
  Administered 2014-10-06: 1 via ORAL
  Filled 2014-10-06: qty 1

## 2014-10-06 MED ORDER — ACETAMINOPHEN 325 MG PO TABS
650.0000 mg | ORAL_TABLET | Freq: Once | ORAL | Status: AC
  Administered 2014-10-06: 650 mg via ORAL
  Filled 2014-10-06: qty 2

## 2014-10-06 MED ORDER — TRAMADOL HCL 50 MG PO TABS: 50.0000 mg | ORAL_TABLET | Freq: Once | ORAL | Status: DC

## 2014-10-06 MED ORDER — OXYCODONE-ACETAMINOPHEN 5-325 MG PO TABS: 1.0000 | ORAL_TABLET | ORAL | Status: DC | PRN

## 2014-10-06 NOTE — ED Notes (Signed)
Pt. Stated, I've had foot pain with swelling, hands and face for 2 days.. I go to the [facility]

## 2014-10-06 NOTE — Discharge Instructions (Signed)
Your evaluation did not show a clear cause for the pain in your feet. Please follow-up with your PCP for further evaluation.   Tract Infection Urinary tract infections (UTIs) can develop anywhere along your urinary tract. Your urinary tract is your body's drainage system for removing wastes and extra water. Your urinary tract includes two kidneys, two ureters, a bladder, and a urethra. Your kidneys are a pair of bean-shaped organs. Each kidney is about the size of your fist. They are located below your ribs, one on each side of your spine. CAUSES Infections are caused by microbes, which are microscopic organisms, including fungi, viruses, and bacteria. These organisms are so small that they can only be seen through a microscope. Bacteria are the microbes that most commonly cause UTIs. SYMPTOMS  Symptoms of UTIs may vary by age and gender of the patient and by the location of the infection. Symptoms in young women typically include a frequent and intense urge to urinate and a painful, burning feeling in the bladder or urethra during urination. Older women and men are more likely to be tired, shaky, and weak and have muscle aches and abdominal pain. A fever may mean the infection is in your kidneys. Other symptoms of a kidney infection include pain in your back or sides below the ribs, nausea, and vomiting. DIAGNOSIS To diagnose a UTI, your caregiver will ask you about your symptoms. Your caregiver also will ask to provide a urine sample. The urine sample will be tested for bacteria and white blood cells. White blood cells are made by your body to help fight infection. TREATMENT  Typically, UTIs can be treated with medication. Because most UTIs are caused by a bacterial infection, they usually can be treated with the use of antibiotics. The choice of antibiotic and length of treatment depend on your symptoms and the type of bacteria causing your infection. HOME CARE INSTRUCTIONS  If you were prescribed  antibiotics, take them exactly as your caregiver instructs you. Finish the medication even if you feel better after you have only taken some of the medication.  Drink enough water and fluids to keep your urine clear or pale yellow.  Avoid caffeine, tea, and carbonated beverages. They tend to irritate your bladder.  Empty your bladder often. Avoid holding urine for long periods of time.  Empty your bladder before and after sexual intercourse.  After a bowel movement, women should cleanse from front to back. Use each tissue only once. SEEK MEDICAL CARE IF:   You have back pain.  You develop a fever.  Your symptoms do not begin to resolve within 3 days. SEEK IMMEDIATE MEDICAL CARE IF:   You have severe back pain or lower abdominal pain.  You develop chills.  You have nausea or vomiting.  You have continued burning or discomfort with urination. MAKE SURE YOU:   Understand these instructions.  Will watch your condition.  Will get help right away if you are not doing well or get worse. Document Released: 01/20/2005 Document Revised: 10/12/2011 Document Reviewed: 05/21/2011 [facility] Patient Information 2015 [place], [place]. This information is not intended to replace advice given to you by your health care provider. Make sure you discuss any questions you have with your health care provider.  Cephalexin tablets or capsules What is this medicine? CEPHALEXIN (sef a LEX in) is a cephalosporin antibiotic. It is used to treat certain kinds of bacterial infections It will not work for colds, flu, or other viral infections. This medicine may be used  for other purposes; ask your health care provider or pharmacist if you have questions. COMMON BRAND NAME(S): Biocef, Keflex, Keftab What should I tell my health care provider before I take this medicine? They need to know if you have any of these conditions: -kidney disease -stomach or intestine problems, especially colitis -an unusual or  allergic reaction to cephalexin, other cephalosporins, penicillins, other antibiotics, medicines, foods, dyes or preservatives -pregnant or trying to get pregnant -breast-feeding How should I use this medicine? Take this medicine by mouth with a full glass of water. Follow the directions on the prescription label. This medicine can be taken with or without food. Take your medicine at regular intervals. Do not take your medicine more often than directed. Take all of your medicine as directed even if you think you are better. Do not skip doses or stop your medicine early. Talk to your pediatrician regarding the use of this medicine in children. While this drug may be prescribed for selected conditions, precautions do apply. Overdosage: If you think you have taken too much of this medicine contact a poison control center or emergency room at once. NOTE: This medicine is only for you. Do not share this medicine with others. What if I miss a dose? If you miss a dose, take it as soon as you can. If it is almost time for your next dose, take only that dose. Do not take double or extra doses. There should be at least 4 to 6 hours between doses. What may interact with this medicine? -probenecid -some other antibiotics This list may not describe all possible interactions. Give your health care provider a list of all the medicines, herbs, non-prescription drugs, or dietary supplements you use. Also tell them if you smoke, drink alcohol, or use illegal drugs. Some items may interact with your medicine. What should I watch for while using this medicine? Tell your doctor or health care professional if your symptoms do not begin to improve in a few days. Do not treat diarrhea with over the counter products. Contact your doctor if you have diarrhea that lasts more than 2 days or if it is severe and watery. If you have diabetes, you may get a false-positive result for sugar in your urine. Check with your doctor or  health care professional. What side effects may I notice from receiving this medicine? Side effects that you should report to your doctor or health care professional as soon as possible: -allergic reactions like skin rash, itching or hives, swelling of the face, lips, or tongue -breathing problems -pain or trouble passing urine -redness, blistering, peeling or loosening of the skin, including inside the mouth -severe or watery diarrhea -unusually weak or tired -yellowing of the eyes, skin Side effects that usually do not require medical attention (report to your doctor or health care professional if they continue or are bothersome): -gas or heartburn -genital or anal irritation -headache -joint or muscle pain -nausea, vomiting This list may not describe all possible side effects. Call your doctor for medical advice about side effects. You may report side effects to FDA at 1-800-FDA-1088. Where should I keep my medicine? Keep out of the reach of children. Store at room temperature between 59 and 86 degrees F (15 and 30 degrees C). Throw away any unused medicine after the expiration date. NOTE: This sheet is a summary. It may not cover all possible information. If you have questions about this medicine, talk to your doctor, pharmacist, or health care provider.  2015,  Elsevier/Gold Standard. (2007-07-17 17:09:13)  Acetaminophen; Oxycodone tablets What is this medicine? ACETAMINOPHEN; OXYCODONE (a set a MEE noe fen; ox i KOE done) is a pain reliever. It is used to treat mild to moderate pain. This medicine may be used for other purposes; ask your health care provider or pharmacist if you have questions. COMMON BRAND NAME(S): Endocet, Magnacet, Narvox, Percocet, Perloxx, Primalev, Primlev, Roxicet, Xolox What should I tell my health care provider before I take this medicine? They need to know if you have any of these conditions: -brain tumor -Crohn's disease, inflammatory bowel disease, or  ulcerative colitis -drug abuse or addiction -head injury -heart or circulation problems -if you often drink alcohol -kidney disease or problems going to the bathroom -liver disease -lung disease, asthma, or breathing problems -an unusual or allergic reaction to acetaminophen, oxycodone, other opioid analgesics, other medicines, foods, dyes, or preservatives -pregnant or trying to get pregnant -breast-feeding How should I use this medicine? Take this medicine by mouth with a full glass of water. Follow the directions on the prescription label. Take your medicine at regular intervals. Do not take your medicine more often than directed. Talk to your pediatrician regarding the use of this medicine in children. Special care may be needed. Patients over 86 years old may have a stronger reaction and need a smaller dose. Overdosage: If you think you have taken too much of this medicine contact a poison control center or emergency room at once. NOTE: This medicine is only for you. Do not share this medicine with others. What if I miss a dose? If you miss a dose, take it as soon as you can. If it is almost time for your next dose, take only that dose. Do not take double or extra doses. What may interact with this medicine? -alcohol -antihistamines -barbiturates like amobarbital, butalbital, butabarbital, methohexital, pentobarbital, phenobarbital, thiopental, and secobarbital -benztropine -drugs for bladder problems like solifenacin, trospium, oxybutynin, tolterodine, hyoscyamine, and methscopolamine -drugs for breathing problems like ipratropium and tiotropium -drugs for certain stomach or intestine problems like propantheline, homatropine methylbromide, glycopyrrolate, atropine, belladonna, and dicyclomine -general anesthetics like etomidate, ketamine, nitrous oxide, propofol, desflurane, enflurane, halothane, isoflurane, and sevoflurane -medicines for depression, anxiety, or psychotic  disturbances -medicines for sleep -muscle relaxants -naltrexone -narcotic medicines (opiates) for pain -phenothiazines like perphenazine, thioridazine, chlorpromazine, mesoridazine, fluphenazine, prochlorperazine, promazine, and trifluoperazine -scopolamine -tramadol -trihexyphenidyl This list may not describe all possible interactions. Give your health care provider a list of all the medicines, herbs, non-prescription drugs, or dietary supplements you use. Also tell them if you smoke, drink alcohol, or use illegal drugs. Some items may interact with your medicine. What should I watch for while using this medicine? Tell your doctor or health care professional if your pain does not go away, if it gets worse, or if you have new or a different type of pain. You may develop tolerance to the medicine. Tolerance means that you will need a higher dose of the medication for pain relief. Tolerance is normal and is expected if you take this medicine for a long time. Do not suddenly stop taking your medicine because you may develop a severe reaction. Your body becomes used to the medicine. This does NOT mean you are addicted. Addiction is a behavior related to getting and using a drug for a non-medical reason. If you have pain, you have a medical reason to take pain medicine. Your doctor will tell you how much medicine to take. If your doctor wants you to stop  the medicine, the dose will be slowly lowered over time to avoid any side effects. You may get drowsy or dizzy. Do not drive, use machinery, or do anything that needs mental alertness until you know how this medicine affects you. Do not stand or sit up quickly, especially if you are an older patient. This reduces the risk of dizzy or fainting spells. Alcohol may interfere with the effect of this medicine. Avoid alcoholic drinks. There are different types of narcotic medicines (opiates) for pain. If you take more than one type at the same time, you may have  more side effects. Give your health care provider a list of all medicines you use. Your doctor will tell you how much medicine to take. Do not take more medicine than directed. Call emergency for help if you have problems breathing. The medicine will cause constipation. Try to have a bowel movement at least every 2 to 3 days. If you do not have a bowel movement for 3 days, call your doctor or health care professional. Do not take Tylenol (acetaminophen) or medicines that have acetaminophen with this medicine. Too much acetaminophen can be very dangerous. Many nonprescription medicines contain acetaminophen. Always read the labels carefully to avoid taking more acetaminophen. What side effects may I notice from receiving this medicine? Side effects that you should report to your doctor or health care professional as soon as possible: -allergic reactions like skin rash, itching or hives, swelling of the face, lips, or tongue -breathing difficulties, wheezing -confusion -light headedness or fainting spells -severe stomach pain -unusually weak or tired -yellowing of the skin or the whites of the eyes Side effects that usually do not require medical attention (report to your doctor or health care professional if they continue or are bothersome): -dizziness -drowsiness -nausea -vomiting This list may not describe all possible side effects. Call your doctor for medical advice about side effects. You may report side effects to FDA at 1-800-FDA-1088. Where should I keep my medicine? Keep out of the reach of children. This medicine can be abused. Keep your medicine in a safe place to protect it from theft. Do not share this medicine with anyone. Selling or giving away this medicine is dangerous and against the law. Store at room temperature between 20 and 25 degrees C (68 and 77 degrees F). Keep container tightly closed. Protect from light. This medicine may cause accidental overdose and death if it is  taken by other adults, children, or pets. Flush any unused medicine down the toilet to reduce the chance of harm. Do not use the medicine after the expiration date. NOTE: This sheet is a summary. It may not cover all possible information. If you have questions about this medicine, talk to your doctor, pharmacist, or health care provider.  2015, Elsevier/Gold Standard. (2012-12-04 13:17:35)
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
## 2014-10-09 LAB — URINE CULTURE: Colony Count: >=100000

## 2014-10-10 ENCOUNTER — Telehealth (HOSPITAL_BASED_OUTPATIENT_CLINIC_OR_DEPARTMENT_OTHER): Payer: Self-pay | Admitting: Emergency Medicine

## 2014-10-10 NOTE — Telephone Encounter (Signed)
Post ED Visit - Positive Culture Follow-up  Culture report reviewed by antimicrobial stewardship pharmacist: []  Wes Dulaney, Pharm.D., BCPS [x]  Heide Guile, Pharm.D., BCPS []  Alycia Rossetti, Pharm.D., BCPS []  [place], Pharm.D., BCPS, AAHIVP []  Legrand Como, Pharm.D., BCPS, AAHIVP []  Isac Sarna, Pharm.D., BCPS  Positive urine culture E. Coli Treated with cephalexin, organism sensitive to the same and no further patient follow-up is required at this time.  Hazle Nordmann 10/10/2014, 1:23 PM

## 2014-10-21 ENCOUNTER — Encounter: Payer: Self-pay | Admitting: Family Medicine

## 2014-10-21 ENCOUNTER — Ambulatory Visit: Payer: Medicaid Other | Attending: Family Medicine | Admitting: Family Medicine

## 2014-10-21 VITALS — BP 132/84 | HR 68 | Temp 98.5°F | Resp 16 | Ht 63.5 in | Wt 157.0 lb

## 2014-10-21 DIAGNOSIS — G894 Chronic pain syndrome: Secondary | ICD-10-CM

## 2014-10-21 DIAGNOSIS — M1990 Unspecified osteoarthritis, unspecified site: Secondary | ICD-10-CM | POA: Diagnosis not present

## 2014-10-21 DIAGNOSIS — Z0000 Encounter for general adult medical examination without abnormal findings: Secondary | ICD-10-CM | POA: Insufficient documentation

## 2014-10-21 DIAGNOSIS — I10 Essential (primary) hypertension: Secondary | ICD-10-CM | POA: Diagnosis not present

## 2014-10-21 DIAGNOSIS — E785 Hyperlipidemia, unspecified: Secondary | ICD-10-CM

## 2014-10-21 MED ORDER — LISINOPRIL 10 MG PO TABS: 10.0000 mg | ORAL_TABLET | Freq: Every day | ORAL | Status: DC

## 2014-10-21 MED ORDER — ACETAMINOPHEN-CODEINE #3 300-30 MG PO TABS: 1.0000 | ORAL_TABLET | Freq: Three times a day (TID) | ORAL | Status: DC | PRN

## 2014-10-21 MED ORDER — AMLODIPINE BESYLATE 10 MG PO TABS: 10.0000 mg | ORAL_TABLET | Freq: Every day | ORAL | Status: DC

## 2014-10-21 MED ORDER — ATORVASTATIN CALCIUM 20 MG PO TABS: 20.0000 mg | ORAL_TABLET | Freq: Every day | ORAL | Status: DC

## 2014-10-21 MED ORDER — DULOXETINE HCL 30 MG PO CPEP: 30.0000 mg | ORAL_CAPSULE | Freq: Every day | ORAL | Status: DC

## 2014-10-21 NOTE — Patient Instructions (Addendum)
Ms, Felicia Medina,  Thank you for coming in today  1. R ankle swelling Now normal  Continue low salt diet Continue BP medicine  2. Chronic pain: I am sorry that you did not have a favorable experience at pain management Continue tylenol #3, voltaren, cymbalta.    F/u in 3-4 weeks for pap smear  F/u in 3 months for HTN  Dr. Adrian Blackwater

## 2014-10-21 NOTE — Progress Notes (Signed)
   Subjective:    Patient ID: Felicia Medina, female    DOB: [birth year], 59 y.o.   MRN: [id number] CC: R ankle swelling, chronic pain   HPI 59 yo F with chronic pain, tobacco abuse, HTN  1. Chronic pain: persistent. No longer established with [facility]. Would like tylenol #3 for chronic pain. No worsening pain or joint swelling.  2. R ankle swelling: has resolved. Taking norvasc for HTN. No redness or excessive pain with swelling. No trauma.  3. Requesting adderal: has trouble focusing and has had trouble since young adulthood. Reports being on adderal from her last PCP. Denies dx of ADHD. Denies drug abuse.    Soc Hx: current smoker  Review of Systems  Constitutional: Negative for fever and chills.  Respiratory: Negative for shortness of breath.   Cardiovascular: Positive for leg swelling. Negative for chest pain.  Gastrointestinal: Negative for abdominal pain and blood in stool.  Skin: Negative for rash.  Psychiatric/Behavioral: Positive for sleep disturbance, dysphoric mood and decreased concentration. Negative for suicidal ideas. The patient is nervous/anxious.        Objective:   Physical Exam BP 132/84 mmHg  Pulse 68  Temp(Src) 98.5 F (36.9 C) (Oral)  Resp 16  Ht 5' 3.5" (1.613 m)  Wt 157 lb (71.215 kg)  BMI 27.37 kg/m2  SpO2 96%  Wt Readings from Last 3 Encounters:  10/21/14 157 lb (71.215 kg)  10/06/14 167 lb 12.8 oz (76.114 kg)  08/19/14 161 lb (73.029 kg)   BP Readings from Last 3 Encounters:  10/21/14 132/84  10/06/14 142/69  08/19/14 174/92  General appearance: alert, cooperative and no distress Lungs: clear to auscultation bilaterally Heart: regular rate and rhythm, S1, S2 normal, no murmur, click, rub or gallop Extremities: extremities normal, atraumatic, no cyanosis or edema       Assessment & Plan:   1. R ankle swelling Now normal  Continue low salt diet Continue BP medicine  2. Chronic pain: I am sorry that you did not have a favorable  experience at pain management Continue tylenol #3, voltaren, cymbalta.  3. Poor focus:  Suspect ADHD vs mood disorder  Recommended psychiatric services

## 2014-10-21 NOTE — Progress Notes (Signed)
F/U Edema  No issues today, stated rt ankle pain worsen at night

## 2014-10-22 NOTE — Assessment & Plan Note (Signed)
Chronic pain: I am sorry that you did not have a favorable experience at pain management Continue tylenol #3, voltaren, cymbalta.

## 2014-10-30 ENCOUNTER — Other Ambulatory Visit: Payer: Self-pay | Admitting: *Deleted

## 2014-10-30 ENCOUNTER — Telehealth: Payer: Self-pay | Admitting: General Practice

## 2014-10-30 MED ORDER — HYDROXYZINE HCL 25 MG PO TABS: 25.0000 mg | ORAL_TABLET | Freq: Three times a day (TID) | ORAL | Status: DC | PRN

## 2014-10-30 NOTE — Telephone Encounter (Signed)
Rx Hydroxyzine 25 mg send to [facility] aware

## 2014-11-18 ENCOUNTER — Other Ambulatory Visit: Payer: Medicaid Other | Admitting: Family Medicine

## 2014-12-02 ENCOUNTER — Ambulatory Visit: Payer: Medicaid Other | Attending: Family Medicine | Admitting: Family Medicine

## 2014-12-02 ENCOUNTER — Encounter: Payer: Self-pay | Admitting: Family Medicine

## 2014-12-02 VITALS — BP 120/80 | HR 66 | Temp 98.6°F | Resp 16 | Ht 63.5 in | Wt 150.0 lb

## 2014-12-02 DIAGNOSIS — G8929 Other chronic pain: Secondary | ICD-10-CM | POA: Diagnosis not present

## 2014-12-02 DIAGNOSIS — G4700 Insomnia, unspecified: Secondary | ICD-10-CM

## 2014-12-02 DIAGNOSIS — K088 Other specified disorders of teeth and supporting structures: Secondary | ICD-10-CM | POA: Insufficient documentation

## 2014-12-02 DIAGNOSIS — K5901 Slow transit constipation: Secondary | ICD-10-CM

## 2014-12-02 DIAGNOSIS — Z0000 Encounter for general adult medical examination without abnormal findings: Secondary | ICD-10-CM | POA: Diagnosis not present

## 2014-12-02 DIAGNOSIS — F17210 Nicotine dependence, cigarettes, uncomplicated: Secondary | ICD-10-CM | POA: Diagnosis not present

## 2014-12-02 DIAGNOSIS — K08129 Complete loss of teeth due to periodontal diseases, unspecified class: Secondary | ICD-10-CM | POA: Diagnosis not present

## 2014-12-02 DIAGNOSIS — K5900 Constipation, unspecified: Secondary | ICD-10-CM | POA: Insufficient documentation

## 2014-12-02 MED ORDER — ZOLPIDEM TARTRATE 5 MG PO TABS: 5.0000 mg | ORAL_TABLET | Freq: Every evening | ORAL | Status: DC | PRN

## 2014-12-02 MED ORDER — POLYETHYLENE GLYCOL 3350 17 GM/SCOOP PO POWD: 17.0000 g | Freq: Every day | ORAL | Status: DC

## 2014-12-02 NOTE — Progress Notes (Signed)
   Subjective:    Patient ID: Felicia Medina, female    DOB: November 23, 1955, 59 y.o.   MRN: [id number] CC: constipation and insomnia  HPI 59 yo F with chronic pain and HTN  1. Constipation: x 1 month. Small, hard stools. No hx of colonoscopy. No N/V or fever.   2. Insomnia: sleep is interrupted. Cannot get comfortable due to chronic pain. Always tired. Sleeps on love seat.   3. Poor dentition: has many missing teeth. No dentures. Would like dentures. Eats poorly. Admits to not eating enough fiber.   History  Substance Use Topics  . Smoking status: Current Every Day Smoker -- 0.33 packs/day    Types: Cigarettes  . Smokeless tobacco: Not on file  . Alcohol Use: No    Review of Systems  Constitutional: Negative for fever and chills.  Eyes: Negative for visual disturbance.  Respiratory: Negative for shortness of breath.   Cardiovascular: Negative for chest pain.  Gastrointestinal: Positive for constipation. Negative for nausea, vomiting, abdominal pain, diarrhea, blood in stool, abdominal distention and rectal pain.  Musculoskeletal: Positive for back pain, arthralgias and neck pain.  Skin: Negative for rash.  Allergic/Immunologic: Negative for immunocompromised state.  Hematological: Negative for adenopathy. Does not bruise/bleed easily.  Psychiatric/Behavioral: Negative for suicidal ideas and dysphoric mood.       Objective:   Physical Exam  Constitutional: She is oriented to person, place, and time. She appears well-developed and well-nourished. No distress.  HENT:  Head: Normocephalic and atraumatic.  Mouth/Throat: Abnormal dentition.  Multiple missing teeth   Pulmonary/Chest: Effort normal.  Abdominal: Soft. Bowel sounds are normal. She exhibits no distension and no mass. There is no tenderness. There is no rebound and no guarding.  Musculoskeletal: She exhibits no edema.  Neurological: She is alert and oriented to person, place, and time.  Skin: Skin is warm and dry. No  rash noted.  Psychiatric: She has a normal mood and affect.  BP 120/80 mmHg  Pulse 66  Temp(Src) 98.6 F (37 C) (Oral)  Resp 16  Ht 5' 3.5" (1.613 m)  Wt 150 lb (68.04 kg)  BMI 26.15 kg/m2  SpO2 96%       Assessment & Plan:

## 2014-12-02 NOTE — Patient Instructions (Signed)
Felicia Medina,  Thank you for coming in today  1. Constipation: miralax ordered   2. Insomnia: ambien daily for one week then every other day for next for 4-8 week course   3. In need of dentures: Dental list provided  4. Healthcare maintenance: GI referral for colonoscopy  F/u in 4-6 weeks for pap smear  Dr. Adrian Blackwater

## 2014-12-02 NOTE — Progress Notes (Signed)
Complaining of constipation  Unable to sleep due to pain

## 2014-12-03 DIAGNOSIS — K08129 Complete loss of teeth due to periodontal diseases, unspecified class: Secondary | ICD-10-CM | POA: Insufficient documentation

## 2014-12-03 NOTE — Assessment & Plan Note (Signed)
Constipation: miralax ordered

## 2014-12-03 NOTE — Assessment & Plan Note (Signed)
In need of dentures: Dental list provided

## 2014-12-03 NOTE — Assessment & Plan Note (Signed)
Insomnia: ambien daily for one week then every other day for next for 4-8 week course

## 2014-12-31 ENCOUNTER — Ambulatory Visit: Payer: Medicaid Other | Attending: Family Medicine | Admitting: Family Medicine

## 2014-12-31 ENCOUNTER — Encounter: Payer: Self-pay | Admitting: Family Medicine

## 2014-12-31 ENCOUNTER — Other Ambulatory Visit (HOSPITAL_COMMUNITY)
Admission: RE | Admit: 2014-12-31 | Discharge: 2014-12-31 | Disposition: A | Discharge status: Home / Self Care | Payer: Medicaid Other | Source: Ambulatory Visit | Attending: Family Medicine | Admitting: Family Medicine

## 2014-12-31 VITALS — BP 110/72 | HR 60 | Temp 98.6°F | Resp 16 | Ht 63.5 in | Wt 156.0 lb

## 2014-12-31 DIAGNOSIS — G894 Chronic pain syndrome: Secondary | ICD-10-CM | POA: Diagnosis not present

## 2014-12-31 DIAGNOSIS — Z113 Encounter for screening for infections with a predominantly sexual mode of transmission: Secondary | ICD-10-CM | POA: Insufficient documentation

## 2014-12-31 DIAGNOSIS — Z124 Encounter for screening for malignant neoplasm of cervix: Secondary | ICD-10-CM

## 2014-12-31 DIAGNOSIS — Z0000 Encounter for general adult medical examination without abnormal findings: Secondary | ICD-10-CM | POA: Diagnosis not present

## 2014-12-31 DIAGNOSIS — Z1151 Encounter for screening for human papillomavirus (HPV): Secondary | ICD-10-CM | POA: Insufficient documentation

## 2014-12-31 DIAGNOSIS — N760 Acute vaginitis: Secondary | ICD-10-CM | POA: Insufficient documentation

## 2014-12-31 DIAGNOSIS — Z01419 Encounter for gynecological examination (general) (routine) without abnormal findings: Secondary | ICD-10-CM | POA: Insufficient documentation

## 2014-12-31 MED ORDER — ACETAMINOPHEN-CODEINE #3 300-30 MG PO TABS: 1.0000 | ORAL_TABLET | Freq: Three times a day (TID) | ORAL | Status: DC | PRN

## 2014-12-31 NOTE — Progress Notes (Signed)
Patient ID: Felicia Medina, female   DOB: 1955/12/28, 59 y.o.   MRN: [id number]   Subjective:  Patient ID: Felicia Medina, female    DOB: 03/09/1956  Age: 59 y.o. MRN: [id number]  CC: Gynecologic Exam   HPI Felicia Medina presents for pap smear, requesting tylenol#3 refill  1. Pap: no vaginal discharge or bleeding. No sex for many years.   Social History  Substance Use Topics  . Smoking status: Current Every Day Smoker -- 0.33 packs/day    Types: Cigarettes  . Smokeless tobacco: Not on file  . Alcohol Use: No    Outpatient Prescriptions Prior to Visit  Medication Sig Dispense Refill  . amLODipine (NORVASC) 10 MG tablet Take 1 tablet (10 mg total) by mouth daily. 30 tablet 11  . atorvastatin (LIPITOR) 20 MG tablet Take 1 tablet (20 mg total) by mouth daily. 30 tablet 11  . diclofenac (VOLTAREN) 75 MG EC tablet Take 1 tablet (75 mg total) by mouth 2 (two) times daily. 60 tablet 2  . DULoxetine (CYMBALTA) 30 MG capsule Take 1 capsule (30 mg total) by mouth daily. 30 capsule 2  . hydrOXYzine (ATARAX/VISTARIL) 25 MG tablet Take 1 tablet (25 mg total) by mouth 3 (three) times daily as needed. 30 tablet 0  . lisinopril (PRINIVIL,ZESTRIL) 10 MG tablet Take 1 tablet (10 mg total) by mouth daily. 30 tablet 11  . polyethylene glycol powder (GLYCOLAX/MIRALAX) powder Take 17 g by mouth daily. 3350 g 1  . zolpidem (AMBIEN) 5 MG tablet Take 1 tablet (5 mg total) by mouth at bedtime as needed for sleep. 15 tablet 1  . albuterol (PROVENTIL HFA;VENTOLIN HFA) 108 (90 BASE) MCG/ACT inhaler Inhale 1 puff into the lungs every 6 (six) hours as needed for wheezing or shortness of breath.    . hydrocortisone 2.5 % cream Apply 1 application topically 2 (two) times daily. Applies to psoriasis on hands and feet as needed    . acetaminophen-codeine (TYLENOL #3) 300-30 MG per tablet Take 1 tablet by mouth every 8 (eight) hours as needed for moderate pain. (Patient not taking: Reported on 12/31/2014) 60 tablet 2    No facility-administered medications prior to visit.    ROS Review of Systems  Constitutional: Negative for fever and chills.  Eyes: Negative for visual disturbance.  Respiratory: Negative for shortness of breath.   Cardiovascular: Negative for chest pain.  Gastrointestinal: Negative for abdominal pain and blood in stool.  Genitourinary: Negative for vaginal bleeding, vaginal discharge, vaginal pain and menstrual problem.  Musculoskeletal: Positive for back pain and neck pain. Negative for arthralgias.  Skin: Negative for rash.  Allergic/Immunologic: Negative for immunocompromised state.  Hematological: Negative for adenopathy. Does not bruise/bleed easily.  Psychiatric/Behavioral: Negative for suicidal ideas and dysphoric mood.    Objective:  BP 110/72 mmHg  Pulse 60  Temp(Src) 98.6 F (37 C) (Oral)  Resp 16  Ht 5' 3.5" (1.613 m)  Wt 156 lb (70.761 kg)  BMI 27.20 kg/m2  SpO2 97%  BP/Weight 12/31/2014 12/02/2014 4/49/6759  Systolic BP 163 846 659  Diastolic BP 72 80 84  Wt. (Lbs) 156 150 157  BMI 27.2 26.15 27.37      Physical Exam  Constitutional: She appears well-developed and well-nourished. No distress.  Pulmonary/Chest: Effort normal.  Genitourinary: Vagina normal. Pelvic exam was performed with patient prone. There is no rash, tenderness or lesion on the right labia. There is no rash, tenderness or lesion on the left labia. Uterus is enlarged (R sided  fullness ) and tender. Cervix exhibits no motion tenderness, no discharge and no friability. No vaginal discharge found.  Musculoskeletal: She exhibits no edema.  Lymphadenopathy:       Right: No inguinal adenopathy present.       Left: No inguinal adenopathy present.  Skin: Skin is warm and dry. No rash noted.     Assessment & Plan:   Problem List Items Addressed This Visit    Chronic pain syndrome (Chronic)   Relevant Medications   acetaminophen-codeine (TYLENOL #3) 300-30 MG per tablet   Healthcare  maintenance   Relevant Orders   MM DIGITAL SCREENING BILATERAL    Other Visit Diagnoses    Pap smear for cervical cancer screening    -  Primary    Relevant Orders    Cytology - PAP [place]       Meds ordered this encounter  Medications  . acetaminophen-codeine (TYLENOL #3) 300-30 MG per tablet    Sig: Take 1 tablet by mouth every 8 (eight) hours as needed for moderate pain.    Dispense:  60 tablet    Refill:  2    Follow-up: Return in about 3 months (around 04/01/2015).   Boykin Nearing MD

## 2014-12-31 NOTE — Progress Notes (Signed)
Annual GYN exam

## 2014-12-31 NOTE — Patient Instructions (Addendum)
Felicia Medina,  [name] to see you today Refilled tylenol #3 Please schedule your mammogram You will be called with pap results  F/u for flu shot in flu shot clinic  F/u with me in 3 months for HTN

## 2015-01-01 LAB — CYTOLOGY - PAP

## 2015-01-01 LAB — CERVICOVAGINAL ANCILLARY ONLY
Chlamydia: NEGATIVE
NEISSERIA GONORRHEA: NEGATIVE

## 2015-01-02 LAB — CERVICOVAGINAL ANCILLARY ONLY: Wet Prep (BD Affirm): NEGATIVE

## 2015-01-08 ENCOUNTER — Telehealth: Payer: Self-pay | Admitting: *Deleted

## 2015-01-08 ENCOUNTER — Telehealth: Payer: Self-pay | Admitting: Family Medicine

## 2015-01-08 DIAGNOSIS — G4700 Insomnia, unspecified: Secondary | ICD-10-CM

## 2015-01-08 NOTE — Telephone Encounter (Signed)
-----   Message from Arnoldo Morale, MD sent at 01/02/2015  9:54 PM EDT ----- Please inform the patient that wet prep and PAP smear are normal. Thank you.

## 2015-01-08 NOTE — Telephone Encounter (Signed)
Unable to contact pt  Phone number disconnected   Mail contact letter to pt

## 2015-01-08 NOTE — Telephone Encounter (Signed)
Patient called back, please f/u  °

## 2015-01-09 MED ORDER — ZOLPIDEM TARTRATE 5 MG PO TABS: 5.0000 mg | ORAL_TABLET | Freq: Every evening | ORAL | Status: DC | PRN

## 2015-01-09 NOTE — Telephone Encounter (Signed)
Patient called to request a med refill for zolpidem (AMBIEN) 5 MG tablet. Please f/u with pt.

## 2015-01-09 NOTE — Telephone Encounter (Signed)
Pt aware Rx at front office  Pap smear results given to pt  Pt verbalized understanding

## 2015-01-09 NOTE — Telephone Encounter (Signed)
ambien refill ready for pick up

## 2015-01-27 ENCOUNTER — Telehealth: Payer: Self-pay | Admitting: Family Medicine

## 2015-01-27 NOTE — Telephone Encounter (Signed)
Patient called requesting for provider to prescribe her a medication, because she can not stop coughing, and it hurts her head and ribs.  Patient also stated she can not eat, and keeps cough drops in her mouth all the time. Please f/u with pt.

## 2015-01-29 ENCOUNTER — Other Ambulatory Visit: Payer: Self-pay | Admitting: Family Medicine

## 2015-01-30 ENCOUNTER — Other Ambulatory Visit: Payer: Self-pay | Admitting: Family Medicine

## 2015-02-04 ENCOUNTER — Telehealth: Payer: Self-pay | Admitting: General Practice

## 2015-02-04 NOTE — Telephone Encounter (Signed)
Patient presents to front office to follow up on medication request for diclofenac (VOLTAREN) 75 MG EC tablet  System shows that the medication was sent on 01/30/15.[facility] states they did not receive the request. Patient also requesting medication for cough. Scheduled patient OV for tomorrow, 02/05/15.

## 2015-02-05 ENCOUNTER — Encounter: Payer: Self-pay | Admitting: Family Medicine

## 2015-02-05 ENCOUNTER — Ambulatory Visit: Payer: Medicaid Other | Attending: Family Medicine | Admitting: Family Medicine

## 2015-02-05 ENCOUNTER — Other Ambulatory Visit: Payer: Self-pay | Admitting: Family Medicine

## 2015-02-05 VITALS — BP 113/76 | HR 68 | Temp 98.6°F | Resp 16 | Ht 63.5 in | Wt 156.0 lb

## 2015-02-05 DIAGNOSIS — J209 Acute bronchitis, unspecified: Secondary | ICD-10-CM | POA: Insufficient documentation

## 2015-02-05 DIAGNOSIS — M1990 Unspecified osteoarthritis, unspecified site: Secondary | ICD-10-CM | POA: Diagnosis not present

## 2015-02-05 DIAGNOSIS — F329 Major depressive disorder, single episode, unspecified: Secondary | ICD-10-CM | POA: Diagnosis not present

## 2015-02-05 DIAGNOSIS — F32A Depression, unspecified: Secondary | ICD-10-CM

## 2015-02-05 MED ORDER — ALBUTEROL SULFATE HFA 108 (90 BASE) MCG/ACT IN AERS: 1.0000 | INHALATION_SPRAY | Freq: Four times a day (QID) | RESPIRATORY_TRACT | Status: DC | PRN

## 2015-02-05 MED ORDER — DICLOFENAC SODIUM 75 MG PO TBEC: 75.0000 mg | DELAYED_RELEASE_TABLET | Freq: Two times a day (BID) | ORAL | Status: DC

## 2015-02-05 MED ORDER — DOXYCYCLINE HYCLATE 100 MG PO TABS: 100.0000 mg | ORAL_TABLET | Freq: Two times a day (BID) | ORAL | Status: AC

## 2015-02-05 MED ORDER — PAROXETINE HCL 20 MG PO TABS: 20.0000 mg | ORAL_TABLET | Freq: Every day | ORAL | Status: DC

## 2015-02-05 MED ORDER — GUAIFENESIN-CODEINE 100-10 MG/5ML PO SOLN: 5.0000 mL | Freq: Four times a day (QID) | ORAL | Status: DC | PRN

## 2015-02-05 MED ORDER — GUAIFENESIN ER 600 MG PO TB12: 1200.0000 mg | ORAL_TABLET | Freq: Two times a day (BID) | ORAL | Status: DC

## 2015-02-05 NOTE — Progress Notes (Signed)
C/C possible bronchitis. Sx x 2 weeks Hx tobacco 1pp3 days  Pain scale#10

## 2015-02-05 NOTE — Progress Notes (Signed)
Subjective:  Patient ID: Felicia Medina, female    DOB: [birth year]  Age: 59 y.o. MRN: [id number]  CC: Bronchitis   HPI ALTHEA BACKS presents for    1. Bronchitis: shortness of breath and cough. No fever. CP with cough. Still smoking.   2. Depression: on Cymbalta since 09/2014. Reports persistent depression. Refuses therapy and referral to mental health. Requesting a different antidepressant.   Social History  Substance Use Topics  . Smoking status: Current Every Day Smoker -- 0.33 packs/day    Types: Cigarettes  . Smokeless tobacco: Not on file  . Alcohol Use: No    Outpatient Prescriptions Prior to Visit  Medication Sig Dispense Refill  . acetaminophen-codeine (TYLENOL #3) 300-30 MG per tablet Take 1 tablet by mouth every 8 (eight) hours as needed for moderate pain. 60 tablet 2  . albuterol (PROVENTIL HFA;VENTOLIN HFA) 108 (90 BASE) MCG/ACT inhaler Inhale 1 puff into the lungs every 6 (six) hours as needed for wheezing or shortness of breath.    [facility] amLODipine (NORVASC) 10 MG tablet Take 1 tablet (10 mg total) by mouth daily. 30 tablet 11  . atorvastatin (LIPITOR) 20 MG tablet Take 1 tablet (20 mg total) by mouth daily. 30 tablet 11  . diclofenac (VOLTAREN) 75 MG EC tablet TAKE 1 TABLET BY MOUTH 2 TIMES DAILY. 60 tablet 2  . DULoxetine (CYMBALTA) 30 MG capsule Take 1 capsule (30 mg total) by mouth daily. 30 capsule 2  . hydrocortisone 2.5 % cream Apply 1 application topically 2 (two) times daily. Applies to psoriasis on hands and feet as needed    . hydrOXYzine (ATARAX/VISTARIL) 25 MG tablet Take 1 tablet (25 mg total) by mouth 3 (three) times daily as needed. 30 tablet 0  . lisinopril (PRINIVIL,ZESTRIL) 10 MG tablet Take 1 tablet (10 mg total) by mouth daily. 30 tablet 11  . polyethylene glycol powder (GLYCOLAX/MIRALAX) powder Take 17 g by mouth daily. 3350 g 1  . zolpidem (AMBIEN) 5 MG tablet Take 1 tablet (5 mg total) by mouth at bedtime as needed for sleep. 15 tablet 1    No facility-administered medications prior to visit.    ROS Review of Systems  Constitutional: Negative for fever and chills.  Eyes: Negative for visual disturbance.  Respiratory: Negative for shortness of breath.   Cardiovascular: Negative for chest pain.  Gastrointestinal: Negative for abdominal pain and blood in stool.  Musculoskeletal: Negative for back pain and arthralgias.  Skin: Negative for rash.  Allergic/Immunologic: Negative for immunocompromised state.  Hematological: Negative for adenopathy. Does not bruise/bleed easily.  Psychiatric/Behavioral: Positive for sleep disturbance and dysphoric mood. Negative for suicidal ideas and self-injury. The patient is nervous/anxious.    GAD-7: score of 18. 3 to 1-6. 0-7.   Objective:  BP 113/76 mmHg  Pulse 68  Temp(Src) 98.6 F (37 C) (Oral)  Resp 16  Ht 5' 3.5" (1.613 m)  Wt 156 lb (70.761 kg)  BMI 27.20 kg/m2  SpO2 97%  BP/Weight 02/05/2015 12/31/2227 11/02/8919  Systolic BP 194 174 081  Diastolic BP 76 72 80  Wt. (Lbs) 156 156 150  BMI 27.2 27.2 26.15    Physical Exam  Constitutional: She is oriented to person, place, and time. She appears well-developed and well-nourished. No distress.  HENT:  Head: Normocephalic and atraumatic.  Nose: Mucosal edema present.  Cardiovascular: Normal rate, regular rhythm, normal heart sounds and intact distal pulses.   Pulmonary/Chest: Effort normal and breath sounds normal. No respiratory distress. She has no  wheezes.  Musculoskeletal: She exhibits no edema.  Neurological: She is alert and oriented to person, place, and time.  Skin: Skin is warm and dry. No rash noted.  Psychiatric: Her mood appears anxious. She exhibits a depressed mood.   Depression screen [facility] 2/9 02/05/2015 12/31/2014 12/02/2014 12/02/2014  Decreased Interest 3 0 0 0  Down, Depressed, Hopeless 3 0 0 0  PHQ - 2 Score 6 0 0 0  Altered sleeping 3 - - -  Tired, decreased energy 3 - - -  Change in appetite 3 - - -   Feeling bad or failure about yourself  3 - - -  Trouble concentrating 3 - - -  Moving slowly or fidgety/restless 3 - - -  Suicidal thoughts 0 - - -  PHQ-9 Score 24 - - -     Assessment & Plan:   Problem List Items Addressed This Visit    Acute bronchitis   Relevant Medications   doxycycline (VIBRA-TABS) 100 MG tablet   guaiFENesin (MUCINEX) 600 MG 12 hr tablet   albuterol (PROVENTIL HFA;VENTOLIN HFA) 108 (90 BASE) MCG/ACT inhaler   guaiFENesin-codeine 100-10 MG/5ML syrup   Arthritis - Primary (Chronic)   Relevant Medications   diclofenac (VOLTAREN) 75 MG EC tablet   Depression   Relevant Medications   PARoxetine (PAXIL) 20 MG tablet      No orders of the defined types were placed in this encounter.    Follow-up: No Follow-up on file.   Boykin Nearing MD

## 2015-02-05 NOTE — Patient Instructions (Addendum)
Felicia Medina was seen today for bronchitis.  Diagnoses and all orders for this visit:  Depression -     PARoxetine (PAXIL) 20 MG tablet; Take 1 tablet (20 mg total) by mouth daily.  Acute bronchitis, unspecified organism -     doxycycline (VIBRA-TABS) 100 MG tablet; Take 1 tablet (100 mg total) by mouth 2 (two) times daily. -     guaiFENesin (MUCINEX) 600 MG 12 hr tablet; Take 2 tablets (1,200 mg total) by mouth 2 (two) times daily. -     albuterol (PROVENTIL HFA;VENTOLIN HFA) 108 (90 BASE) MCG/ACT inhaler; Inhale 1 puff into the lungs every 6 (six) hours as needed for wheezing or shortness of breath. -     guaiFENesin-codeine 100-10 MG/5ML syrup; Take 5 mLs by mouth every 6 (six) hours as needed for cough.  Other orders -     Cancel: POCT rapid strep A   Start paxil   F/u in 6 weeks for depression  Dr. Adrian Blackwater

## 2015-02-06 ENCOUNTER — Encounter: Payer: Self-pay | Admitting: Family Medicine

## 2015-04-14 ENCOUNTER — Encounter: Payer: Self-pay | Admitting: Family Medicine

## 2015-04-14 ENCOUNTER — Ambulatory Visit: Payer: Medicaid Other | Attending: Family Medicine | Admitting: Family Medicine

## 2015-04-14 VITALS — BP 132/84 | HR 73 | Temp 98.4°F | Resp 16 | Ht 63.5 in | Wt 156.0 lb

## 2015-04-14 DIAGNOSIS — F17210 Nicotine dependence, cigarettes, uncomplicated: Secondary | ICD-10-CM | POA: Insufficient documentation

## 2015-04-14 DIAGNOSIS — F17200 Nicotine dependence, unspecified, uncomplicated: Secondary | ICD-10-CM

## 2015-04-14 DIAGNOSIS — E785 Hyperlipidemia, unspecified: Secondary | ICD-10-CM

## 2015-04-14 DIAGNOSIS — Z79899 Other long term (current) drug therapy: Secondary | ICD-10-CM | POA: Diagnosis not present

## 2015-04-14 DIAGNOSIS — G894 Chronic pain syndrome: Secondary | ICD-10-CM | POA: Insufficient documentation

## 2015-04-14 DIAGNOSIS — M1990 Unspecified osteoarthritis, unspecified site: Secondary | ICD-10-CM | POA: Insufficient documentation

## 2015-04-14 DIAGNOSIS — F419 Anxiety disorder, unspecified: Secondary | ICD-10-CM | POA: Diagnosis not present

## 2015-04-14 MED ORDER — DIAZEPAM 10 MG PO TABS: 5.0000 mg | ORAL_TABLET | Freq: Every evening | ORAL | Status: DC | PRN

## 2015-04-14 MED ORDER — ALBUTEROL SULFATE 108 (90 BASE) MCG/ACT IN AEPB: 2.0000 | INHALATION_SPRAY | Freq: Four times a day (QID) | RESPIRATORY_TRACT | Status: DC | PRN

## 2015-04-14 MED ORDER — ACETAMINOPHEN-CODEINE #3 300-30 MG PO TABS: 1.0000 | ORAL_TABLET | Freq: Three times a day (TID) | ORAL | Status: DC | PRN

## 2015-04-14 MED ORDER — DICLOFENAC SODIUM 75 MG PO TBEC: 75.0000 mg | DELAYED_RELEASE_TABLET | Freq: Two times a day (BID) | ORAL | Status: DC

## 2015-04-14 MED ORDER — ATORVASTATIN CALCIUM 20 MG PO TABS: 20.0000 mg | ORAL_TABLET | Freq: Every day | ORAL | Status: DC

## 2015-04-14 NOTE — Assessment & Plan Note (Signed)
A: chronic anxiety with depression and insomnia. No improvement with cymbalta, paxil or ambien.  P: Valium 5-10 mg Qhs D/c Lorrin Mais

## 2015-04-14 NOTE — Patient Instructions (Signed)
Felicia Medina was seen today for depression and anxiety.  Diagnoses and all orders for this visit:  Chronic pain syndrome -     acetaminophen-codeine (TYLENOL #3) 300-30 MG tablet; Take 1 tablet by mouth every 8 (eight) hours as needed for moderate pain.  TOBACCO ABUSE -     Albuterol Sulfate (PROAIR RESPICLICK) 123XX123 (90 BASE) MCG/ACT AEPB; Inhale 2 puffs into the lungs every 6 (six) hours as needed.  Chronic anxiety -     diazepam (VALIUM) 10 MG tablet; Take 0.5-1 tablets (5-10 mg total) by mouth at bedtime as needed for anxiety.  HLD (hyperlipidemia) -     atorvastatin (LIPITOR) 20 MG tablet; Take 1 tablet (20 mg total) by mouth daily.  Arthritis -     diclofenac (VOLTAREN) 75 MG EC tablet; Take 1 tablet (75 mg total) by mouth 2 (two) times daily.   F/u in 4 weeks for anxiety with trouble sleeping and smoking cessation  Dr. Adrian Blackwater

## 2015-04-14 NOTE — Progress Notes (Signed)
F/U Depression and Anxiety  Unable to sleep due to anxiety  Pain scale 10 Medicine refill Rx Tylenol #3 tobacco user 1p per 3 days No suicidal thought

## 2015-04-14 NOTE — Progress Notes (Signed)
Subjective:  Patient ID: Felicia Medina, female    DOB: 19-Jun-1955  Age: 59 y.o. MRN: [id number]  CC: Depression and Anxiety   HPI Felicia Medina presents for   1. depression and anxiety: requesting BZ for sleep. Feels that lack of sleep is major source of mood symptoms. She sleeps 1-2 hrs per night. She is irritable and depressed during the day. She has tried cymbalta, [place] and paxil without improvement in symptoms. Was previously on xanax for anxiety and insomnia. She has not been prescribed a BZ in about 2 years.   2. Arthritis: in multiple areas including hands. Taking NSAID. Taking tylenol #3. Pain is tolerable.   3. SOB: proair is not helping with SOB. She continues to smoke. She request another form of albuterol. No CP or fever.   Social History  Substance Use Topics  . Smoking status: Current Every Day Smoker -- 0.33 packs/day    Types: Cigarettes  . Smokeless tobacco: Not on file  . Alcohol Use: No    Outpatient Prescriptions Prior to Visit  Medication Sig Dispense Refill  . acetaminophen-codeine (TYLENOL #3) 300-30 MG per tablet Take 1 tablet by mouth every 8 (eight) hours as needed for moderate pain. 60 tablet 2  . albuterol (PROVENTIL HFA;VENTOLIN HFA) 108 (90 BASE) MCG/ACT inhaler Inhale 1 puff into the lungs every 6 (six) hours as needed for wheezing or shortness of breath. 8 g 6  . amLODipine (NORVASC) 10 MG tablet Take 1 tablet (10 mg total) by mouth daily. 30 tablet 11  . atorvastatin (LIPITOR) 20 MG tablet Take 1 tablet (20 mg total) by mouth daily. 30 tablet 11  . diclofenac (VOLTAREN) 75 MG EC tablet Take 1 tablet (75 mg total) by mouth 2 (two) times daily. 60 tablet 2  . guaiFENesin (MUCINEX) 600 MG 12 hr tablet Take 2 tablets (1,200 mg total) by mouth 2 (two) times daily. 30 tablet 0  . guaiFENesin-codeine 100-10 MG/5ML syrup Take 5 mLs by mouth every 6 (six) hours as needed for cough. 120 mL 0  . hydrocortisone 2.5 % cream Apply 1 application topically 2  (two) times daily. Applies to psoriasis on hands and feet as needed    . lisinopril (PRINIVIL,ZESTRIL) 10 MG tablet Take 1 tablet (10 mg total) by mouth daily. 30 tablet 11  . PARoxetine (PAXIL) 20 MG tablet Take 1 tablet (20 mg total) by mouth daily. 30 tablet 1  . polyethylene glycol powder (GLYCOLAX/MIRALAX) powder Take 17 g by mouth daily. 3350 g 1  . zolpidem (AMBIEN) 5 MG tablet Take 1 tablet (5 mg total) by mouth at bedtime as needed for sleep. 15 tablet 1   No facility-administered medications prior to visit.    ROS Review of Systems  Constitutional: Negative for fever and chills.  Eyes: Negative for visual disturbance.  Respiratory: Negative for shortness of breath.   Cardiovascular: Negative for chest pain.  Gastrointestinal: Negative for abdominal pain and blood in stool.  Musculoskeletal: Positive for joint swelling and arthralgias. Negative for back pain.  Skin: Negative for rash.  Allergic/Immunologic: Negative for immunocompromised state.  Hematological: Negative for adenopathy. Does not bruise/bleed easily.  Psychiatric/Behavioral: Positive for sleep disturbance. Negative for suicidal ideas, self-injury and dysphoric mood. The patient is nervous/anxious.     Objective:  BP 132/84 mmHg  Pulse 73  Temp(Src) 98.4 F (36.9 C) (Oral)  Resp 16  Ht 5' 3.5" (1.613 m)  Wt 156 lb (70.761 kg)  BMI 27.20 kg/m2  SpO2 97%  BP/Weight 04/14/2015 0000000 123XX123  Systolic BP [id number]  Diastolic BP 84 76 72  Wt. (Lbs) 156 156 156  BMI 27.2 27.2 27.2     Physical Exam  Constitutional: She is oriented to person, place, and time. She appears well-developed and well-nourished. No distress.  HENT:  Head: Normocephalic and atraumatic.  Cardiovascular: Normal rate, regular rhythm, normal heart sounds and intact distal pulses.   Pulmonary/Chest: Effort normal and breath sounds normal. No respiratory distress. She has no wheezes.  Musculoskeletal: She exhibits no edema.        Arms: Neurological: She is alert and oriented to person, place, and time.  Skin: Skin is warm and dry. No rash noted.  Psychiatric: Her speech is normal and behavior is normal. Judgment and thought content normal. Her mood appears anxious. Cognition and memory are normal. She exhibits a depressed mood.    Depression screen [facility] 2/9 04/14/2015 02/05/2015 12/31/2014 12/02/2014 12/02/2014  Decreased Interest 3 3 0 0 0  Down, Depressed, Hopeless 3 3 0 0 0  PHQ - 2 Score 6 6 0 0 0  Altered sleeping 3 3 - - -  Tired, decreased energy 3 3 - - -  Change in appetite 1 3 - - -  Feeling bad or failure about yourself  1 3 - - -  Trouble concentrating 2 3 - - -  Moving slowly or fidgety/restless 3 3 - - -  Suicidal thoughts 2 0 - - -  PHQ-9 Score 21 24 - - -    GAD 7 : Generalized Anxiety Score 04/14/2015  Nervous, Anxious, on Edge 3  Control/stop worrying 3  Worry too much - different things 3  Trouble relaxing 3  Restless 3  Easily annoyed or irritable 3  Afraid - awful might happen 3  Total GAD 7 Score 21    Assessment & Plan:   Problem List Items Addressed This Visit    Arthritis (Chronic)   Relevant Medications   acetaminophen-codeine (TYLENOL #3) 300-30 MG tablet   diclofenac (VOLTAREN) 75 MG EC tablet   Chronic anxiety (Chronic)    A: chronic anxiety with depression and insomnia. No improvement with cymbalta, paxil or ambien.  P: Valium 5-10 mg Qhs D/c ambien       Relevant Medications   diazepam (VALIUM) 10 MG tablet   Chronic pain syndrome - Primary (Chronic)   Relevant Medications   acetaminophen-codeine (TYLENOL #3) 300-30 MG tablet   HLD (hyperlipidemia) (Chronic)   Relevant Medications   atorvastatin (LIPITOR) 20 MG tablet   TOBACCO ABUSE (Chronic)   Relevant Medications   Albuterol Sulfate (PROAIR RESPICLICK) 123XX123 (90 BASE) MCG/ACT AEPB      No orders of the defined types were placed in this encounter.    Follow-up: No Follow-up on file.   Boykin Nearing  MD

## 2015-04-30 MED FILL — LISINOPRIL 10 MG TABLET: 10 | 30 days supply | Qty: 30 | Fill #2

## 2015-04-30 MED FILL — AMLODIPINE BESYLATE 10 MG T: 10 | 30 days supply | Qty: 30 | Fill #5

## 2015-05-06 ENCOUNTER — Other Ambulatory Visit: Payer: Self-pay | Admitting: Family Medicine

## 2015-05-06 DIAGNOSIS — F17200 Nicotine dependence, unspecified, uncomplicated: Secondary | ICD-10-CM

## 2015-05-06 MED ORDER — ALBUTEROL SULFATE HFA 108 (90 BASE) MCG/ACT IN AERS: 2.0000 | INHALATION_SPRAY | Freq: Four times a day (QID) | RESPIRATORY_TRACT | Status: DC | PRN

## 2015-05-06 MED FILL — PROAIR HFA 90 MCG INHALER: 108 (90 BAS | 25 days supply | Qty: 9 | Fill #0

## 2015-05-07 ENCOUNTER — Telehealth: Payer: Self-pay | Admitting: Family Medicine

## 2015-05-07 DIAGNOSIS — F419 Anxiety disorder, unspecified: Secondary | ICD-10-CM

## 2015-05-07 MED FILL — ATORVASTATIN 20 MG TABLET: 20 | 30 days supply | Qty: 30 | Fill #6

## 2015-05-07 NOTE — Telephone Encounter (Signed)
Patient called requesting a medication refill diazepam, tylenol 3,

## 2015-05-08 MED ORDER — DIAZEPAM 10 MG PO TABS: 5.0000 mg | ORAL_TABLET | Freq: Every evening | ORAL | Status: DC | PRN

## 2015-05-08 NOTE — Telephone Encounter (Signed)
Tylenol # 3 refilled on 04/14/15 with 2 refills, patient needs to contact pharmacy, advised to check pill bottles to determine if she has refills available.   Diazepam refilled with one additional refill, ready for pick up

## 2015-05-09 NOTE — Telephone Encounter (Signed)
Pt aware Rx at front office  

## 2015-05-13 ENCOUNTER — Other Ambulatory Visit: Payer: Self-pay | Admitting: Family Medicine

## 2015-05-13 DIAGNOSIS — Z1231 Encounter for screening mammogram for malignant neoplasm of breast: Secondary | ICD-10-CM

## 2015-05-26 ENCOUNTER — Ambulatory Visit: Payer: Medicaid Other | Admitting: Family Medicine

## 2015-06-02 MED FILL — AMLODIPINE BESYLATE 10 MG T: 10 | 30 days supply | Qty: 30 | Fill #6

## 2015-06-02 MED FILL — LISINOPRIL 10 MG TABLET: 10 | 30 days supply | Qty: 30 | Fill #3

## 2015-06-03 ENCOUNTER — Encounter: Payer: Self-pay | Admitting: Family Medicine

## 2015-06-03 ENCOUNTER — Ambulatory Visit: Payer: Medicaid Other | Attending: Family Medicine | Admitting: Family Medicine

## 2015-06-03 VITALS — BP 137/89 | HR 90 | Temp 97.8°F | Resp 16 | Ht 63.5 in | Wt 155.0 lb

## 2015-06-03 DIAGNOSIS — F17210 Nicotine dependence, cigarettes, uncomplicated: Secondary | ICD-10-CM | POA: Diagnosis not present

## 2015-06-03 DIAGNOSIS — Z0000 Encounter for general adult medical examination without abnormal findings: Secondary | ICD-10-CM | POA: Diagnosis not present

## 2015-06-03 DIAGNOSIS — J069 Acute upper respiratory infection, unspecified: Secondary | ICD-10-CM | POA: Insufficient documentation

## 2015-06-03 DIAGNOSIS — Z79899 Other long term (current) drug therapy: Secondary | ICD-10-CM | POA: Diagnosis not present

## 2015-06-03 DIAGNOSIS — R300 Dysuria: Secondary | ICD-10-CM | POA: Insufficient documentation

## 2015-06-03 DIAGNOSIS — F419 Anxiety disorder, unspecified: Secondary | ICD-10-CM | POA: Insufficient documentation

## 2015-06-03 LAB — POCT URINALYSIS DIPSTICK
Bilirubin, UA: NEGATIVE
Blood, UA: NEGATIVE
Glucose, UA: NEGATIVE
KETONES UA: 80
Nitrite, UA: POSITIVE
PROTEIN UA: 30
Spec Grav, UA: 1.025
Urobilinogen, UA: 0.2
pH, UA: 6

## 2015-06-03 LAB — POCT GLYCOSYLATED HEMOGLOBIN (HGB A1C): Hemoglobin A1C: 6.3

## 2015-06-03 MED ORDER — LEVOFLOXACIN 500 MG PO TABS: 500.0000 mg | ORAL_TABLET | Freq: Every day | ORAL | Status: DC

## 2015-06-03 MED FILL — ACETAMINOPHEN/COD #3 TABLET: 300-30 | 20 days supply | Qty: 60 | Fill #1

## 2015-06-03 MED FILL — levoFLOXacin 500 MG TABS: 500 | 7 days supply | Qty: 7 | Fill #0

## 2015-06-03 NOTE — Progress Notes (Signed)
F/U anxiety  C/C  Cold Sx. urine with odor and frequency urination  Pain scale #10 No suicidal thought in the past two weeks Tobacco user 1 cigarette per day

## 2015-06-03 NOTE — Assessment & Plan Note (Signed)
Suspect UTI  Urine culture levaquin

## 2015-06-03 NOTE — Progress Notes (Signed)
Subjective:  Patient ID: Felicia Medina, female    DOB: 21-Nov-1955  Age: 60 y.o. MRN: [id number]  CC: Anxiety   HPI Felicia Medina presents for    1. Anxiety: valium is helping. She is able to sleep better.  2. URI:x  2 weeks. Congestion. Slowly improving. No fever or chills. Dry cough. Also had diarrhea. Non bloody. Sick contacts at home.   3. Dysuria: x 2 weeks with frequency. No fever or chill. No nausea, emesis or flank pain.  Social History  Substance Use Topics  . Smoking status: Current Every Day Smoker -- 0.33 packs/day    Types: Cigarettes  . Smokeless tobacco: Not on file  . Alcohol Use: No    Outpatient Prescriptions Prior to Visit  Medication Sig Dispense Refill  . acetaminophen-codeine (TYLENOL #3) 300-30 MG tablet Take 1 tablet by mouth every 8 (eight) hours as needed for moderate pain. 60 tablet 2  . albuterol (PROVENTIL HFA;VENTOLIN HFA) 108 (90 Base) MCG/ACT inhaler Inhale 2 puffs into the lungs every 6 (six) hours as needed for wheezing or shortness of breath. 1 Inhaler 3  . amLODipine (NORVASC) 10 MG tablet Take 1 tablet (10 mg total) by mouth daily. 30 tablet 11  . atorvastatin (LIPITOR) 20 MG tablet Take 1 tablet (20 mg total) by mouth daily. 30 tablet 11  . diazepam (VALIUM) 10 MG tablet Take 0.5-1 tablets (5-10 mg total) by mouth at bedtime as needed for anxiety. 30 tablet 1  . diclofenac (VOLTAREN) 75 MG EC tablet Take 1 tablet (75 mg total) by mouth 2 (two) times daily. 60 tablet 5  . hydrocortisone 2.5 % cream Apply 1 application topically 2 (two) times daily. Reported on 04/14/2015    . lisinopril (PRINIVIL,ZESTRIL) 10 MG tablet Take 1 tablet (10 mg total) by mouth daily. 30 tablet 11   No facility-administered medications prior to visit.    ROS Review of Systems  Constitutional: Negative for fever and chills.  HENT: Positive for congestion.   Eyes: Negative for visual disturbance.  Respiratory: Positive for cough. Negative for shortness of  breath.   Cardiovascular: Negative for chest pain.  Gastrointestinal: Positive for diarrhea. Negative for abdominal pain and blood in stool.  Genitourinary: Positive for dysuria and frequency.  Musculoskeletal: Positive for joint swelling and arthralgias. Negative for back pain.  Skin: Negative for rash.  Allergic/Immunologic: Negative for immunocompromised state.  Hematological: Negative for adenopathy. Does not bruise/bleed easily.  Psychiatric/Behavioral: Positive for sleep disturbance. Negative for suicidal ideas, self-injury and dysphoric mood. The patient is nervous/anxious.     Objective:  BP 137/89 mmHg  Pulse 90  Temp(Src) 97.8 F (36.6 C) (Oral)  Resp 16  Ht 5' 3.5" (1.613 m)  Wt 155 lb (70.308 kg)  BMI 27.02 kg/m2  SpO2 96%  BP/Weight 06/03/2015 04/14/2015 0000000  Systolic BP 0000000 [id number]  Diastolic BP 89 84 76  Wt. (Lbs) 155 156 156  BMI 27.02 27.2 27.2    Physical Exam  Constitutional: She is oriented to person, place, and time. She appears well-developed and well-nourished. No distress.  HENT:  Head: Normocephalic and atraumatic.  Cardiovascular: Normal rate, regular rhythm, normal heart sounds and intact distal pulses.   Pulmonary/Chest: Effort normal and breath sounds normal. No respiratory distress. She has no wheezes.  Abdominal: Soft. Bowel sounds are normal. She exhibits no distension and no mass. There is no tenderness. There is no rebound, no guarding and no CVA tenderness.  Musculoskeletal: She exhibits no edema.  Arms: Neurological: She is alert and oriented to person, place, and time.  Skin: Skin is warm and dry. No rash noted.  Psychiatric: Her speech is normal and behavior is normal. Judgment and thought content normal. Her mood appears anxious. Cognition and memory are normal. She exhibits a depressed mood.   UA:  Positive nitrites, moderate leukocytes Lab Results  Component Value Date   HGBA1C 6.30 06/03/2015   GAD 7 : Generalized  Anxiety Score 06/03/2015 04/14/2015  Nervous, Anxious, on Edge 2 3  Control/stop worrying 2 3  Worry too much - different things 2 3  Trouble relaxing 2 3  Restless 2 3  Easily annoyed or irritable 2 3  Afraid - awful might happen 2 3  Total GAD 7 Score 14 21     Depression screen [facility] 2/9 06/03/2015 04/14/2015 02/05/2015  Decreased Interest 2 3 3   Down, Depressed, Hopeless 2 3 3   PHQ - 2 Score 4 6 6   Altered sleeping 1 3 3   Tired, decreased energy 3 3 3   Change in appetite 2 1 3   Feeling bad or failure about yourself  2 1 3   Trouble concentrating 2 2 3   Moving slowly or fidgety/restless 2 3 3   Suicidal thoughts 0 2 0  PHQ-9 Score 16 21 24      Assessment & Plan:   Felicia Medina was seen today for anxiety, uri and urinary frequency.  Diagnoses and all orders for this visit:  Healthcare maintenance -     HgB A1c -     POCT urinalysis dipstick  Dysuria -     Urine culture -     levofloxacin (LEVAQUIN) 500 MG tablet; Take 1 tablet (500 mg total) by mouth daily.    No orders of the defined types were placed in this encounter.    Follow-up: No Follow-up on file.   Boykin Nearing MD

## 2015-06-03 NOTE — Assessment & Plan Note (Signed)
have a viral URI with cough. For this please do the following:  1. Drink plenty of fluids. Hot tea, soup etc will help open your nasal passages. 2. Dextromethorphan 30 mg every 6-8 hrs (plain Robitussin) for cough suppression 3. Tylenol for pain up to 1000 mg three times daily.  4. Nasal saline-OTC nose spray for congestion.   F/u for chest pain, shortness of breath, persistent high fever.

## 2015-06-03 NOTE — Patient Instructions (Addendum)
Amyra was seen today for anxiety, uri and urinary frequency.  Diagnoses and all orders for this visit:  Healthcare maintenance -     HgB A1c -     POCT urinalysis dipstick  Dysuria -     Urine culture -     levofloxacin (LEVAQUIN) 500 MG tablet; Take 1 tablet (500 mg total) by mouth daily.   You have a viral URI with cough. For this please do the following:  1. Drink plenty of fluids. Hot tea, soup etc will help open your nasal passages. 2. Dextromethorphan 30 mg every 6-8 hrs (plain Robitussin) for cough suppression 3. Tylenol for pain up to 1000 mg three times daily.  4. Nasal saline-OTC nose spray for congestion.   F/u for chest pain, shortness of breath, persistent high fever.  F/u in  2 months for anxiety   Dr. Adrian Blackwater  Urinary Tract Infection Urinary tract infections (UTIs) can develop anywhere along your urinary tract. Your urinary tract is your body's drainage system for removing wastes and extra water. Your urinary tract includes two kidneys, two ureters, a bladder, and a urethra. Your kidneys are a pair of bean-shaped organs. Each kidney is about the size of your fist. They are located below your ribs, one on each side of your spine. CAUSES Infections are caused by microbes, which are microscopic organisms, including fungi, viruses, and bacteria. These organisms are so small that they can only be seen through a microscope. Bacteria are the microbes that most commonly cause UTIs. SYMPTOMS  Symptoms of UTIs may vary by age and gender of the patient and by the location of the infection. Symptoms in young women typically include a frequent and intense urge to urinate and a painful, burning feeling in the bladder or urethra during urination. Older women and men are more likely to be tired, shaky, and weak and have muscle aches and abdominal pain. A fever may mean the infection is in your kidneys. Other symptoms of a kidney infection include pain in your back or sides below the  ribs, nausea, and vomiting. DIAGNOSIS To diagnose a UTI, your caregiver will ask you about your symptoms. Your caregiver will also ask you to provide a urine sample. The urine sample will be tested for bacteria and white blood cells. White blood cells are made by your body to help fight infection. TREATMENT  Typically, UTIs can be treated with medication. Because most UTIs are caused by a bacterial infection, they usually can be treated with the use of antibiotics. The choice of antibiotic and length of treatment depend on your symptoms and the type of bacteria causing your infection. HOME CARE INSTRUCTIONS  If you were prescribed antibiotics, take them exactly as your caregiver instructs you. Finish the medication even if you feel better after you have only taken some of the medication.  Drink enough water and fluids to keep your urine clear or pale yellow.  Avoid caffeine, tea, and carbonated beverages. They tend to irritate your bladder.  Empty your bladder often. Avoid holding urine for long periods of time.  Empty your bladder before and after sexual intercourse.  After a bowel movement, women should cleanse from front to back. Use each tissue only once. SEEK MEDICAL CARE IF:   You have back pain.  You develop a fever.  Your symptoms do not begin to resolve within 3 days. SEEK IMMEDIATE MEDICAL CARE IF:   You have severe back pain or lower abdominal pain.  You develop chills.  You  have nausea or vomiting.  You have continued burning or discomfort with urination. MAKE SURE YOU:   Understand these instructions.  Will watch your condition.  Will get help right away if you are not doing well or get worse.   This information is not intended to replace advice given to you by your health care provider. Make sure you discuss any questions you have with your health care provider.   Document Released: 01/20/2005 Document Revised: 01/01/2015 Document Reviewed:  05/21/2011 Elsevier Interactive Patient Education [employer].

## 2015-06-03 NOTE — Assessment & Plan Note (Signed)
A: chronic anxiety with depression and insomnia. No improvement with cymbalta, paxil or ambien. Slight improvement with valium 5-10 mg Qhs P: Continue valium

## 2015-06-05 LAB — URINE CULTURE: Colony Count: >=100000

## 2015-06-09 MED FILL — ATORVASTATIN 20 MG TABLET: 20 | 30 days supply | Qty: 30 | Fill #7

## 2015-06-11 ENCOUNTER — Telehealth: Payer: Self-pay | Admitting: *Deleted

## 2015-06-11 NOTE — Telephone Encounter (Signed)
Unable to contact pt Phone number not available

## 2015-06-11 NOTE — Telephone Encounter (Signed)
-----   Message from Boykin Nearing, MD sent at 06/05/2015  3:22 PM EST ----- Urine culture with E coli Sensitive to levaquin Complete  levaquin

## 2015-06-11 NOTE — Telephone Encounter (Signed)
Patient returned nurse phone call. °Please follow up. °

## 2015-06-12 NOTE — Telephone Encounter (Signed)
Date of birth verified by pt  Lab results given  Pt stated finish antibiotic feeling a lot better now Drinking lots of water

## 2015-06-19 MED FILL — ?DICLOFENAC SOD DR 75 MG TA: 75 | 30 days supply | Qty: 60 | Fill #2

## 2015-06-20 ENCOUNTER — Encounter: Payer: Self-pay | Admitting: Clinical

## 2015-06-20 NOTE — Progress Notes (Signed)
Depression screen [facility] 2/9 06/03/2015 04/14/2015 02/05/2015 12/31/2014 12/02/2014  Decreased Interest 2 3 3  0 0  Down, Depressed, Hopeless 2 3 3  0 0  PHQ - 2 Score 4 6 6  0 0  Altered sleeping 1 3 3  - -  Tired, decreased energy 3 3 3  - -  Change in appetite 2 1 3  - -  Feeling bad or failure about yourself  2 1 3  - -  Trouble concentrating 2 2 3  - -  Moving slowly or fidgety/restless 2 3 3  - -  Suicidal thoughts 0 2 0 - -  PHQ-9 Score 16 21 24  - -    GAD 7 : Generalized Anxiety Score 06/03/2015 04/14/2015  Nervous, Anxious, on Edge 2 3  Control/stop worrying 2 3  Worry too much - different things 2 3  Trouble relaxing 2 3  Restless 2 3  Easily annoyed or irritable 2 3  Afraid - awful might happen 2 3  Total GAD 7 Score 14 21

## 2015-07-04 ENCOUNTER — Other Ambulatory Visit: Payer: Self-pay | Admitting: Family Medicine

## 2015-07-04 DIAGNOSIS — F419 Anxiety disorder, unspecified: Secondary | ICD-10-CM

## 2015-07-04 MED ORDER — DIAZEPAM 10 MG PO TABS: 5.0000 mg | ORAL_TABLET | Freq: Every evening | ORAL | Status: DC | PRN

## 2015-07-04 MED FILL — ACETAMINOPHEN/COD #3 TABLET: 300-30 | 20 days supply | Qty: 60 | Fill #2

## 2015-07-04 MED FILL — LISINOPRIL 10 MG TABLET: 10 | 30 days supply | Qty: 30 | Fill #4

## 2015-07-04 MED FILL — AMLODIPINE BESYLATE 10 MG T: 10 | 30 days supply | Qty: 30 | Fill #7

## 2015-07-04 NOTE — Telephone Encounter (Signed)
Diazepam refilled Patient was waiting in front office Script signed in to med book Patient signed out script

## 2015-07-04 NOTE — Telephone Encounter (Signed)
Patient came in stating that she needs a refill for Diazepam. Please follow up.

## 2015-07-15 MED FILL — ATORVASTATIN 20 MG TABLET: 20 | 30 days supply | Qty: 30 | Fill #8

## 2015-08-04 MED FILL — LISINOPRIL 10 MG TABLET: 10 | 30 days supply | Qty: 30 | Fill #5

## 2015-08-04 MED FILL — AMLODIPINE BESYLATE 10 MG T: 10 | 30 days supply | Qty: 30 | Fill #8

## 2015-08-19 ENCOUNTER — Ambulatory Visit: Payer: Medicaid Other | Admitting: Family Medicine

## 2015-08-26 ENCOUNTER — Ambulatory Visit: Payer: Medicaid Other | Admitting: Family Medicine

## 2015-09-02 ENCOUNTER — Ambulatory Visit: Payer: Medicaid Other | Attending: Family Medicine | Admitting: Family Medicine

## 2015-09-02 ENCOUNTER — Encounter: Payer: Self-pay | Admitting: Family Medicine

## 2015-09-02 VITALS — BP 114/73 | HR 68 | Temp 98.9°F | Resp 16 | Ht 63.0 in | Wt 155.0 lb

## 2015-09-02 DIAGNOSIS — F419 Anxiety disorder, unspecified: Secondary | ICD-10-CM | POA: Diagnosis not present

## 2015-09-02 DIAGNOSIS — L309 Dermatitis, unspecified: Secondary | ICD-10-CM | POA: Diagnosis not present

## 2015-09-02 DIAGNOSIS — Z79899 Other long term (current) drug therapy: Secondary | ICD-10-CM | POA: Diagnosis not present

## 2015-09-02 DIAGNOSIS — F17200 Nicotine dependence, unspecified, uncomplicated: Secondary | ICD-10-CM

## 2015-09-02 DIAGNOSIS — E785 Hyperlipidemia, unspecified: Secondary | ICD-10-CM | POA: Diagnosis not present

## 2015-09-02 DIAGNOSIS — F17210 Nicotine dependence, cigarettes, uncomplicated: Secondary | ICD-10-CM | POA: Diagnosis not present

## 2015-09-02 DIAGNOSIS — I10 Essential (primary) hypertension: Secondary | ICD-10-CM | POA: Diagnosis not present

## 2015-09-02 DIAGNOSIS — M1990 Unspecified osteoarthritis, unspecified site: Secondary | ICD-10-CM

## 2015-09-02 DIAGNOSIS — G894 Chronic pain syndrome: Secondary | ICD-10-CM | POA: Diagnosis not present

## 2015-09-02 MED ORDER — ACETAMINOPHEN-CODEINE #3 300-30 MG PO TABS: 1.0000 | ORAL_TABLET | Freq: Three times a day (TID) | ORAL | Status: DC | PRN

## 2015-09-02 MED ORDER — LISINOPRIL 10 MG PO TABS: 10.0000 mg | ORAL_TABLET | Freq: Every day | ORAL | Status: DC

## 2015-09-02 MED ORDER — ALBUTEROL SULFATE HFA 108 (90 BASE) MCG/ACT IN AERS: 2.0000 | INHALATION_SPRAY | Freq: Four times a day (QID) | RESPIRATORY_TRACT | Status: DC | PRN

## 2015-09-02 MED ORDER — ATORVASTATIN CALCIUM 20 MG PO TABS: 20.0000 mg | ORAL_TABLET | Freq: Every day | ORAL | Status: DC

## 2015-09-02 MED ORDER — DIAZEPAM 10 MG PO TABS: 5.0000 mg | ORAL_TABLET | Freq: Every evening | ORAL | Status: DC | PRN

## 2015-09-02 MED ORDER — DICLOFENAC SODIUM 75 MG PO TBEC: 75.0000 mg | DELAYED_RELEASE_TABLET | Freq: Two times a day (BID) | ORAL | Status: DC

## 2015-09-02 MED ORDER — AMLODIPINE BESYLATE 10 MG PO TABS: 10.0000 mg | ORAL_TABLET | Freq: Every day | ORAL | Status: DC

## 2015-09-02 MED ORDER — HYDROCORTISONE 2.5 % EX CREA: 1.0000 "application " | TOPICAL_CREAM | Freq: Two times a day (BID) | CUTANEOUS | Status: DC

## 2015-09-02 MED FILL — DICLOFENAC SOD DR 75 MG TAB: 75 | 30 days supply | Qty: 60 | Fill #0

## 2015-09-02 MED FILL — ACETAMINOPHEN/COD #3 TABLET: 300-30 | 20 days supply | Qty: 60 | Fill #0

## 2015-09-02 MED FILL — PROAIR HFA 90 MCG INHALER: 108 (90 BAS | 30 days supply | Qty: 9 | Fill #0

## 2015-09-02 MED FILL — ATORVASTATIN 20 MG TABLET: 20 | 30 days supply | Qty: 30 | Fill #0

## 2015-09-02 MED FILL — HYDROCORTISONE 2.5% CREAM: 2.5 | 30 days supply | Qty: 30 | Fill #0

## 2015-09-02 MED FILL — AMLODIPINE BESYLATE 10 MG T: 10 | 30 days supply | Qty: 30 | Fill #0

## 2015-09-02 MED FILL — LISINOPRIL 10 MG TABLET: 10 | 30 days supply | Qty: 30 | Fill #0

## 2015-09-02 NOTE — Progress Notes (Signed)
Medicine refills  F/U anxiety, knee pain  Pain scale # 10 No suicidal thought in the past two weeks Tobacco user 3-4

## 2015-09-02 NOTE — Assessment & Plan Note (Signed)
Has diffuse DJD  Continue tylenol #3

## 2015-09-02 NOTE — Assessment & Plan Note (Signed)
Well-controlled.  Continue current regimen. 

## 2015-09-02 NOTE — Progress Notes (Signed)
Patient ID: Felicia Medina, female   DOB: 22-Jan-1956, 60 y.o.   MRN: [id number]   Subjective:  Patient ID: Felicia Medina, female    DOB: Jan 02, 1956  Age: 60 y.o. MRN: [id number]  CC: Anxiety   HPI JOCHEBED INOCENCIO presents for    1. Anxiety: valium is helping. She is able to sleep better. Here for refill.  2. CHRONIC HYPERTENSION  Disease Monitoring  Blood pressure range: not checking   Chest pain: no   Dyspnea: no   Claudication: no   Medication compliance: yes  Medication Side Effects  Lightheadedness: no   Urinary frequency: no   Edema: no     3. Chronic pain: due to DJD. Still having joint pain and swelling. Taking tylenol #3. Request refill. Would prefer percocet or oxycodone as she states it helps more with pain control. Smoking 1/3 PPD.  4. Edentulous: had all of her bad teeth removed. Will have a new set of dentures in 4 weeks. Very happy and feels much better with her health and mood.   Social History  Substance Use Topics  . Smoking status: Current Every Day Smoker -- 0.33 packs/day    Types: Cigarettes  . Smokeless tobacco: Not on file  . Alcohol Use: No    Outpatient Prescriptions Prior to Visit  Medication Sig Dispense Refill  . amLODipine (NORVASC) 10 MG tablet Take 1 tablet (10 mg total) by mouth daily. 30 tablet 11  . acetaminophen-codeine (TYLENOL #3) 300-30 MG tablet Take 1 tablet by mouth every 8 (eight) hours as needed for moderate pain. (Patient not taking: Reported on 09/02/2015) 60 tablet 2  . albuterol (PROVENTIL HFA;VENTOLIN HFA) 108 (90 Base) MCG/ACT inhaler Inhale 2 puffs into the lungs every 6 (six) hours as needed for wheezing or shortness of breath. (Patient not taking: Reported on 09/02/2015) 1 Inhaler 3  . atorvastatin (LIPITOR) 20 MG tablet Take 1 tablet (20 mg total) by mouth daily. (Patient not taking: Reported on 09/02/2015) 30 tablet 11  . diazepam (VALIUM) 10 MG tablet Take 0.5-1 tablets (5-10 mg total) by mouth at bedtime as needed for  anxiety. (Patient not taking: Reported on 09/02/2015) 30 tablet 1  . diclofenac (VOLTAREN) 75 MG EC tablet Take 1 tablet (75 mg total) by mouth 2 (two) times daily. (Patient not taking: Reported on 09/02/2015) 60 tablet 5  . hydrocortisone 2.5 % cream Apply 1 application topically 2 (two) times daily. Reported on 09/02/2015    . levofloxacin (LEVAQUIN) 500 MG tablet Take 1 tablet (500 mg total) by mouth daily. (Patient not taking: Reported on 09/02/2015) 7 tablet 0  . lisinopril (PRINIVIL,ZESTRIL) 10 MG tablet Take 1 tablet (10 mg total) by mouth daily. (Patient not taking: Reported on 09/02/2015) 30 tablet 11   No facility-administered medications prior to visit.    ROS Review of Systems  Constitutional: Negative for fever and chills.  Eyes: Negative for visual disturbance.  Respiratory: Negative for shortness of breath.   Cardiovascular: Negative for chest pain.  Gastrointestinal: Negative for abdominal pain and blood in stool.  Musculoskeletal: Positive for joint swelling and arthralgias. Negative for back pain.  Skin: Negative for rash.  Allergic/Immunologic: Negative for immunocompromised state.  Hematological: Negative for adenopathy. Does not bruise/bleed easily.  Psychiatric/Behavioral: Negative for suicidal ideas, sleep disturbance, self-injury and dysphoric mood. The patient is nervous/anxious.     Objective:  BP 114/73 mmHg  Pulse 68  Temp(Src) 98.9 F (37.2 C) (Oral)  Resp 16  Ht 5\' 3"  (1.6 m)  Wt 155 lb (70.308 kg)  BMI 27.46 kg/m2  SpO2 99%  BP/Weight 09/02/2015 06/03/2015 0000000  Systolic BP 99991111 0000000 Q000111Q  Diastolic BP 73 89 84  Wt. (Lbs) 155 155 156  BMI 27.46 27.02 27.2    Physical Exam  Constitutional: She is oriented to person, place, and time. She appears well-developed and well-nourished. No distress.  HENT:  Head: Normocephalic and atraumatic.  Cardiovascular: Normal rate, regular rhythm, normal heart sounds and intact distal pulses.   Pulmonary/Chest: Effort  normal and breath sounds normal. No respiratory distress. She has no wheezes.  Abdominal: Soft. Bowel sounds are normal. She exhibits no distension and no mass. There is no tenderness. There is no rebound, no guarding and no CVA tenderness.  Musculoskeletal: She exhibits no edema.       Arms: Neurological: She is alert and oriented to person, place, and time.  Skin: Skin is warm and dry. No rash noted.  Psychiatric: Her speech is normal and behavior is normal. Judgment and thought content normal. Her mood appears anxious. Cognition and memory are normal. She exhibits a depressed mood.   UA:  Positive nitrites, moderate leukocytes Lab Results  Component Value Date   HGBA1C 6.30 06/03/2015   GAD 7 : Generalized Anxiety Score 09/02/2015 06/03/2015 04/14/2015  Nervous, Anxious, on Edge 0 2 3  Control/stop worrying 3 2 3   Worry too much - different things 3 2 3   Trouble relaxing 3 2 3   Restless 3 2 3   Easily annoyed or irritable 3 2 3   Afraid - awful might happen 0 2 3  Total GAD 7 Score 15 14 21      Depression screen [facility] 2/9 09/02/2015 06/03/2015 04/14/2015  Decreased Interest 1 2 3   Down, Depressed, Hopeless 0 2 3  PHQ - 2 Score 1 4 6   Altered sleeping 3 1 3   Tired, decreased energy 0 3 3  Change in appetite 0 2 1  Feeling bad or failure about yourself  0 2 1  Trouble concentrating 3 2 2   Moving slowly or fidgety/restless 0 2 3  Suicidal thoughts 0 0 2  PHQ-9 Score 7 16 21      Assessment & Plan:   Eisa was seen today for anxiety.  Diagnoses and all orders for this visit:  Chronic pain syndrome -     Discontinue: acetaminophen-codeine (TYLENOL #3) 300-30 MG tablet; Take 1 tablet by mouth every 8 (eight) hours as needed for moderate pain. -     acetaminophen-codeine (TYLENOL #3) 300-30 MG tablet; Take 1 tablet by mouth every 8 (eight) hours as needed for moderate pain.  TOBACCO ABUSE -     albuterol (PROVENTIL HFA;VENTOLIN HFA) 108 (90 Base) MCG/ACT inhaler; Inhale 2 puffs  into the lungs every 6 (six) hours as needed for wheezing or shortness of breath.  Essential hypertension -     amLODipine (NORVASC) 10 MG tablet; Take 1 tablet (10 mg total) by mouth daily. -     lisinopril (PRINIVIL,ZESTRIL) 10 MG tablet; Take 1 tablet (10 mg total) by mouth daily.  HLD (hyperlipidemia) -     atorvastatin (LIPITOR) 20 MG tablet; Take 1 tablet (20 mg total) by mouth daily.  Arthritis -     diclofenac (VOLTAREN) 75 MG EC tablet; Take 1 tablet (75 mg total) by mouth 2 (two) times daily.  Chronic anxiety -     Discontinue: diazepam (VALIUM) 10 MG tablet; Take 0.5-1 tablets (5-10 mg total) by mouth at bedtime as needed for anxiety. -  diazepam (VALIUM) 10 MG tablet; Take 0.5-1 tablets (5-10 mg total) by mouth at bedtime as needed for anxiety.  Dermatitis -     hydrocortisone 2.5 % cream; Apply 1 application topically 2 (two) times daily.   No orders of the defined types were placed in this encounter.    Follow-up: No Follow-up on file.   Boykin Nearing MD

## 2015-09-02 NOTE — Patient Instructions (Addendum)
Lakresha was seen today for anxiety.  Diagnoses and all orders for this visit:  Chronic pain syndrome -     Discontinue: acetaminophen-codeine (TYLENOL #3) 300-30 MG tablet; Take 1 tablet by mouth every 8 (eight) hours as needed for moderate pain. -     acetaminophen-codeine (TYLENOL #3) 300-30 MG tablet; Take 1 tablet by mouth every 8 (eight) hours as needed for moderate pain.  TOBACCO ABUSE -     albuterol (PROVENTIL HFA;VENTOLIN HFA) 108 (90 Base) MCG/ACT inhaler; Inhale 2 puffs into the lungs every 6 (six) hours as needed for wheezing or shortness of breath.  Essential hypertension -     amLODipine (NORVASC) 10 MG tablet; Take 1 tablet (10 mg total) by mouth daily. -     lisinopril (PRINIVIL,ZESTRIL) 10 MG tablet; Take 1 tablet (10 mg total) by mouth daily.  HLD (hyperlipidemia) -     atorvastatin (LIPITOR) 20 MG tablet; Take 1 tablet (20 mg total) by mouth daily.  Arthritis -     diclofenac (VOLTAREN) 75 MG EC tablet; Take 1 tablet (75 mg total) by mouth 2 (two) times daily.  Chronic anxiety -     Discontinue: diazepam (VALIUM) 10 MG tablet; Take 0.5-1 tablets (5-10 mg total) by mouth at bedtime as needed for anxiety. -     diazepam (VALIUM) 10 MG tablet; Take 0.5-1 tablets (5-10 mg total) by mouth at bedtime as needed for anxiety.  Dermatitis -     hydrocortisone 2.5 % cream; Apply 1 application topically 2 (two) times daily.   Smoking cessation support: smoking cessation hotline: 1-800-QUIT-NOW.  Smoking cessation classes are available through [facility] and [facility]. Call [phone] or visit our website at [email or web address]   F/u in 3 months for HTN and smoking cessation  Dr. Adrian Blackwater

## 2015-10-01 MED FILL — AMLODIPINE BESYLATE 10 MG T: 10 | 30 days supply | Qty: 30 | Fill #1

## 2015-10-01 MED FILL — LISINOPRIL 10 MG TABLET: 10 | 30 days supply | Qty: 30 | Fill #1

## 2015-10-01 MED FILL — ATORVASTATIN 20 MG TABLET: 20 | 30 days supply | Qty: 30 | Fill #1

## 2015-10-06 MED FILL — ACETAMINOPHEN/COD #3 TABLET: 300-30 | 20 days supply | Qty: 60 | Fill #1

## 2015-10-31 MED FILL — AMLODIPINE BESYLATE 10 MG T: 10 | 30 days supply | Qty: 30 | Fill #2

## 2015-10-31 MED FILL — ATORVASTATIN 20 MG TABLET: 20 | 30 days supply | Qty: 30 | Fill #2

## 2015-10-31 MED FILL — DICLOFENAC SOD DR 75 MG TAB: 75 | 30 days supply | Qty: 60 | Fill #1

## 2015-10-31 MED FILL — LISINOPRIL 10 MG TABLET: 10 | 30 days supply | Qty: 30 | Fill #2

## 2015-11-11 MED FILL — ACETAMINOPHEN/COD #3 TABLET: 300-30 | 20 days supply | Qty: 60 | Fill #2

## 2015-12-04 MED FILL — ATORVASTATIN 20 MG TABLET: 20 | 30 days supply | Qty: 30 | Fill #3

## 2015-12-04 MED FILL — LISINOPRIL 10 MG TABLET: 10 | 30 days supply | Qty: 30 | Fill #3

## 2015-12-04 MED FILL — AMLODIPINE BESYLATE 10 MG T: 10 | 30 days supply | Qty: 30 | Fill #3

## 2015-12-09 ENCOUNTER — Ambulatory Visit: Payer: Medicaid Other | Admitting: Family Medicine

## 2015-12-11 MED FILL — ACETAMINOPHEN/COD #3 TABLET: 300-30 | 20 days supply | Qty: 60 | Fill #3

## 2015-12-30 ENCOUNTER — Other Ambulatory Visit: Payer: Self-pay | Admitting: Family Medicine

## 2015-12-30 DIAGNOSIS — F419 Anxiety disorder, unspecified: Secondary | ICD-10-CM

## 2015-12-30 MED ORDER — DIAZEPAM 10 MG PO TABS: 5.0000 mg | ORAL_TABLET | Freq: Every evening | ORAL | 3 refills | Status: DC | PRN

## 2015-12-30 NOTE — Telephone Encounter (Signed)
Pt. Called requesting a refill on diazepam (VALIUM) 10 MG tablet Please f/u with pt.

## 2015-12-30 NOTE — Telephone Encounter (Signed)
Valium refill ready for pick up Patient will need to review and sign controlled substance contract

## 2015-12-30 NOTE — Telephone Encounter (Signed)
Message states voicemail not set up. Priscille Heidelberg, RN, BSN

## 2016-01-06 ENCOUNTER — Ambulatory Visit: Payer: Medicaid Other | Admitting: Family Medicine

## 2016-01-08 MED FILL — DICLOFENAC SOD DR 75 MG TAB: 75 | 30 days supply | Qty: 60 | Fill #2

## 2016-01-08 MED FILL — ATORVASTATIN 20 MG TABLET: 20 | 30 days supply | Qty: 30 | Fill #4

## 2016-01-08 MED FILL — LISINOPRIL 10 MG TABLET: 10 | 30 days supply | Qty: 30 | Fill #4

## 2016-01-08 MED FILL — AMLODIPINE BESYLATE 10 MG T: 10 | 30 days supply | Qty: 30 | Fill #4

## 2016-01-09 ENCOUNTER — Other Ambulatory Visit: Payer: Self-pay | Admitting: Family Medicine

## 2016-01-09 ENCOUNTER — Telehealth: Payer: Self-pay | Admitting: Family Medicine

## 2016-01-09 DIAGNOSIS — G894 Chronic pain syndrome: Secondary | ICD-10-CM

## 2016-01-09 NOTE — Telephone Encounter (Signed)
Patient needs a refill for Tylenol 3. Please follow up.

## 2016-01-09 NOTE — Telephone Encounter (Signed)
Ready for pick up Patient due for f/u appt

## 2016-01-09 NOTE — Telephone Encounter (Signed)
Tylenol #3 ready for pick up F/u appt needed for additional refills

## 2016-01-12 NOTE — Telephone Encounter (Signed)
Pt has a VM that's not set up. Script will be in log book upfront.

## 2016-01-29 ENCOUNTER — Ambulatory Visit: Payer: Medicaid Other | Attending: Family Medicine | Admitting: Family Medicine

## 2016-01-29 ENCOUNTER — Encounter: Payer: Self-pay | Admitting: Family Medicine

## 2016-01-29 VITALS — BP 113/73 | HR 64 | Temp 97.9°F | Ht 63.0 in | Wt 154.6 lb

## 2016-01-29 DIAGNOSIS — L309 Dermatitis, unspecified: Secondary | ICD-10-CM

## 2016-01-29 DIAGNOSIS — K08109 Complete loss of teeth, unspecified cause, unspecified class: Secondary | ICD-10-CM

## 2016-01-29 DIAGNOSIS — F419 Anxiety disorder, unspecified: Secondary | ICD-10-CM | POA: Diagnosis present

## 2016-01-29 DIAGNOSIS — G894 Chronic pain syndrome: Secondary | ICD-10-CM | POA: Diagnosis not present

## 2016-01-29 DIAGNOSIS — R7309 Other abnormal glucose: Secondary | ICD-10-CM

## 2016-01-29 DIAGNOSIS — I10 Essential (primary) hypertension: Secondary | ICD-10-CM | POA: Diagnosis not present

## 2016-01-29 DIAGNOSIS — Z131 Encounter for screening for diabetes mellitus: Secondary | ICD-10-CM | POA: Diagnosis not present

## 2016-01-29 DIAGNOSIS — Z1159 Encounter for screening for other viral diseases: Secondary | ICD-10-CM

## 2016-01-29 DIAGNOSIS — H538 Other visual disturbances: Secondary | ICD-10-CM | POA: Diagnosis not present

## 2016-01-29 DIAGNOSIS — R350 Frequency of micturition: Secondary | ICD-10-CM | POA: Insufficient documentation

## 2016-01-29 DIAGNOSIS — M1990 Unspecified osteoarthritis, unspecified site: Secondary | ICD-10-CM

## 2016-01-29 DIAGNOSIS — F17200 Nicotine dependence, unspecified, uncomplicated: Secondary | ICD-10-CM | POA: Diagnosis not present

## 2016-01-29 DIAGNOSIS — Z79899 Other long term (current) drug therapy: Secondary | ICD-10-CM | POA: Insufficient documentation

## 2016-01-29 DIAGNOSIS — F17210 Nicotine dependence, cigarettes, uncomplicated: Secondary | ICD-10-CM | POA: Insufficient documentation

## 2016-01-29 DIAGNOSIS — Z972 Presence of dental prosthetic device (complete) (partial): Secondary | ICD-10-CM

## 2016-01-29 DIAGNOSIS — E785 Hyperlipidemia, unspecified: Secondary | ICD-10-CM

## 2016-01-29 LAB — COMPLETE METABOLIC PANEL WITH GFR
ALBUMIN: 4.4 g/dL (ref 3.6–5.1)
ALK PHOS: 71 U/L (ref 33–130)
ALT: 18 U/L (ref 6–29)
AST: 16 U/L (ref 10–35)
BILIRUBIN TOTAL: 0.4 mg/dL (ref 0.2–1.2)
BUN: 12 mg/dL (ref 7–25)
CO2: 26 mmol/L (ref 20–31)
CREATININE: 0.67 mg/dL (ref 0.50–0.99)
Calcium: 10.3 mg/dL (ref 8.6–10.4)
Chloride: 107 mmol/L (ref 98–110)
GFR, Est African American: >89 mL/min (ref >=60)
GFR, Est Non African American: >89 mL/min (ref >=60)
GLUCOSE: 89 mg/dL (ref 65–99)
Potassium: 4 mmol/L (ref 3.5–5.3)
SODIUM: 140 mmol/L (ref 135–146)
TOTAL PROTEIN: 6.9 g/dL (ref 6.1–8.1)

## 2016-01-29 LAB — LIPID PANEL
CHOL/HDL RATIO: 4 ratio (ref <=5.0)
CHOLESTEROL: 164 mg/dL (ref 125–200)
HDL: 41 mg/dL — ABNORMAL LOW (ref >=46)
LDL Cholesterol: 92 mg/dL (ref <130)
Triglycerides: 153 mg/dL — ABNORMAL HIGH (ref <150)
VLDL: 31 mg/dL — ABNORMAL HIGH (ref <30)

## 2016-01-29 LAB — CBC
HCT: 41.1 % (ref 35.0–45.0)
Hemoglobin: 14.3 g/dL (ref 11.7–15.5)
MCH: 30.1 pg (ref 27.0–33.0)
MCHC: 34.8 g/dL (ref 32.0–36.0)
MCV: 86.5 fL (ref 80.0–100.0)
MPV: 9.9 fL (ref 7.5–12.5)
PLATELETS: 272 10*3/uL (ref 140–400)
RBC: 4.75 MIL/uL (ref 3.80–5.10)
RDW: 12.8 % (ref 11.0–15.0)
WBC: 7 10*3/uL (ref 3.8–10.8)

## 2016-01-29 LAB — GLUCOSE, POCT (MANUAL RESULT ENTRY): POC GLUCOSE: 87 mg/dL (ref 70–99)

## 2016-01-29 LAB — POCT GLYCOSYLATED HEMOGLOBIN (HGB A1C): Hemoglobin A1C: 6

## 2016-01-29 MED ORDER — AMLODIPINE BESYLATE 10 MG PO TABS: 10.0000 mg | ORAL_TABLET | Freq: Every day | ORAL | 3 refills | Status: DC

## 2016-01-29 MED ORDER — DICLOFENAC SODIUM 75 MG PO TBEC: 75.0000 mg | DELAYED_RELEASE_TABLET | Freq: Two times a day (BID) | ORAL | 3 refills | Status: DC

## 2016-01-29 MED ORDER — ACETAMINOPHEN-CODEINE #3 300-30 MG PO TABS: ORAL_TABLET | ORAL | 0 refills | Status: DC

## 2016-01-29 MED ORDER — HYDROCORTISONE 2.5 % EX CREA: 1.0000 "application " | TOPICAL_CREAM | Freq: Two times a day (BID) | CUTANEOUS | 11 refills | Status: AC

## 2016-01-29 MED ORDER — ALBUTEROL SULFATE HFA 108 (90 BASE) MCG/ACT IN AERS: 2.0000 | INHALATION_SPRAY | Freq: Four times a day (QID) | RESPIRATORY_TRACT | 3 refills | Status: AC | PRN

## 2016-01-29 MED ORDER — ATORVASTATIN CALCIUM 20 MG PO TABS: 20.0000 mg | ORAL_TABLET | Freq: Every day | ORAL | 3 refills | Status: DC

## 2016-01-29 MED ORDER — LISINOPRIL 10 MG PO TABS: 10.0000 mg | ORAL_TABLET | Freq: Every day | ORAL | 3 refills | Status: DC

## 2016-01-29 NOTE — Assessment & Plan Note (Signed)
A1c is elevated but not in diabetes range Advised low carb diet Increase exercise Trend A1c every 6 months

## 2016-01-29 NOTE — Patient Instructions (Addendum)
Beanca was seen today for follow-up.  Diagnoses and all orders for this visit:  Diabetes mellitus screening -     HgB A1c -     Glucose (CBG)  Essential hypertension -     COMPLETE METABOLIC PANEL WITH GFR -     lisinopril (PRINIVIL,ZESTRIL) 10 MG tablet; Take 1 tablet (10 mg total) by mouth daily. -     amLODipine (NORVASC) 10 MG tablet; Take 1 tablet (10 mg total) by mouth daily.  Hyperlipidemia, unspecified hyperlipidemia type -     atorvastatin (LIPITOR) 20 MG tablet; Take 1 tablet (20 mg total) by mouth daily. -     Lipid Panel  TOBACCO ABUSE -     albuterol (PROVENTIL HFA;VENTOLIN HFA) 108 (90 Base) MCG/ACT inhaler; Inhale 2 puffs into the lungs every 6 (six) hours as needed for wheezing or shortness of breath.  Arthritis -     diclofenac (VOLTAREN) 75 MG EC tablet; Take 1 tablet (75 mg total) by mouth 2 (two) times daily. -     CBC  Dermatitis -     hydrocortisone 2.5 % cream; Apply 1 application topically 2 (two) times daily.  Need for hepatitis C screening test -     Hepatitis C antibody, reflex  Chronic pain syndrome   All meds refilled You will be ca Call when ready for mammogram and colonoscopy   F/u in 3 months for check up   Dr. Adrian Blackwater

## 2016-01-29 NOTE — Progress Notes (Signed)
Subjective:  Patient ID: Felicia Medina, female    DOB: 06-22-1955  Age: 59 y.o. MRN: [id number]  CC: Follow-up (anxiety)   HPI Felicia Medina presents for   1. Anxiety: stable chronic anxiety. Valium help keeps her mood stable.   2. Med refills: will like all meds sent to [facility]. Needs tylenol #3 refilled.   3. She would like to be screened for diabetes. She has blurry vision at times and urinary frequency at night. No polydipsia.   Social History  Substance Use Topics  . Smoking status: Current Every Day Smoker    Packs/day: 0.33    Types: Cigarettes  . Smokeless tobacco: Not on file  . Alcohol use No   Outpatient Medications Prior to Visit  Medication Sig Dispense Refill  . acetaminophen-codeine (TYLENOL #3) 300-30 MG tablet TAKE 1 TABLET BY MOUTH EVERY 8 HOURS AS NEEDED FOR MODERATE PAIN. 60 tablet 0  . albuterol (PROVENTIL HFA;VENTOLIN HFA) 108 (90 Base) MCG/ACT inhaler Inhale 2 puffs into the lungs every 6 (six) hours as needed for wheezing or shortness of breath. 1 Inhaler 3  . amLODipine (NORVASC) 10 MG tablet Take 1 tablet (10 mg total) by mouth daily. 30 tablet 11  . atorvastatin (LIPITOR) 20 MG tablet Take 1 tablet (20 mg total) by mouth daily. 30 tablet 11  . diazepam (VALIUM) 10 MG tablet Take 0.5-1 tablets (5-10 mg total) by mouth at bedtime as needed for anxiety. 30 tablet 3  . diclofenac (VOLTAREN) 75 MG EC tablet Take 1 tablet (75 mg total) by mouth 2 (two) times daily. 60 tablet 5  . hydrocortisone 2.5 % cream Apply 1 application topically 2 (two) times daily. 30 g 3  . lisinopril (PRINIVIL,ZESTRIL) 10 MG tablet Take 1 tablet (10 mg total) by mouth daily. 30 tablet 11   No facility-administered medications prior to visit.     ROS Review of Systems  Constitutional: Negative for chills and fever.  Eyes: Positive for visual disturbance (blurry at times ).  Respiratory: Negative for shortness of breath.   Cardiovascular: Negative for chest pain.    Gastrointestinal: Negative for abdominal pain and blood in stool.  Genitourinary: Positive for frequency (at night ).  Musculoskeletal: Positive for joint swelling. Negative for arthralgias and back pain.  Skin: Negative for rash.  Allergic/Immunologic: Negative for immunocompromised state.  Hematological: Negative for adenopathy. Does not bruise/bleed easily.  Psychiatric/Behavioral: Negative for dysphoric mood, self-injury, sleep disturbance and suicidal ideas. The patient is nervous/anxious.     Objective:  BP 113/73 (BP Location: Left Arm, Patient Position: Sitting, Cuff Size: Small)   Pulse 64   Temp 97.9 F (36.6 C) (Oral)   Ht 5\' 3"  (1.6 m)   Wt 154 lb 9.6 oz (70.1 kg)   SpO2 98%   BMI 27.39 kg/m   BP/Weight 01/29/2016 [id number]  Systolic BP 123456 [id number]  Diastolic BP 73 73 89  Wt. (Lbs) 154.6 155 155  BMI 27.39 27.46 27.02   Physical Exam  Constitutional: She is oriented to person, place, and time. She appears well-developed and well-nourished. No distress.  HENT:  Head: Normocephalic and atraumatic.  Mouth/Throat: She has dentures.  Cardiovascular: Normal rate, regular rhythm, normal heart sounds and intact distal pulses.   Pulmonary/Chest: Effort normal and breath sounds normal. No respiratory distress. She has no wheezes.  Abdominal: Soft. Bowel sounds are normal. She exhibits no distension and no mass. There is no tenderness. There is no rebound, no guarding and no CVA  tenderness.  Musculoskeletal: She exhibits no edema.       Arms: Neurological: She is alert and oriented to person, place, and time.  Skin: Skin is warm and dry. No rash noted.  Psychiatric: She has a normal mood and affect. Her speech is normal and behavior is normal. Judgment and thought content normal. Cognition and memory are normal.   Depression screen [facility] 2/9 01/29/2016 09/02/2015 06/03/2015  Decreased Interest 0 1 2  Down, Depressed, Hopeless 0 0 2  PHQ - 2 Score 0 1 4  Altered sleeping  3 3 1   Tired, decreased energy 2 0 3  Change in appetite 0 0 2  Feeling bad or failure about yourself  0 0 2  Trouble concentrating 3 3 2   Moving slowly or fidgety/restless 0 0 2  Suicidal thoughts 0 0 0  PHQ-9 Score 8 7 16    GAD 7 : Generalized Anxiety Score 01/29/2016 09/02/2015 06/03/2015 04/14/2015  Nervous, Anxious, on Edge 3 0 2 3  Control/stop worrying 3 3 2 3   Worry too much - different things 3 3 2 3   Trouble relaxing 3 3 2 3   Restless 3 3 2 3   Easily annoyed or irritable 3 3 2 3   Afraid - awful might happen 3 0 2 3  Total GAD 7 Score 21 15 14 21     Lab Results  Component Value Date   HGBA1C 6.0 01/29/2016   CBG 87  Assessment & Plan:  [place] was seen today for follow-up.  Diagnoses and all orders for this visit:  Diabetes mellitus screening -     HgB A1c -     Glucose (CBG)  Essential hypertension -     COMPLETE METABOLIC PANEL WITH GFR -     lisinopril (PRINIVIL,ZESTRIL) 10 MG tablet; Take 1 tablet (10 mg total) by mouth daily. -     amLODipine (NORVASC) 10 MG tablet; Take 1 tablet (10 mg total) by mouth daily.  Hyperlipidemia, unspecified hyperlipidemia type -     atorvastatin (LIPITOR) 20 MG tablet; Take 1 tablet (20 mg total) by mouth daily. -     Lipid Panel  TOBACCO ABUSE -     albuterol (PROVENTIL HFA;VENTOLIN HFA) 108 (90 Base) MCG/ACT inhaler; Inhale 2 puffs into the lungs every 6 (six) hours as needed for wheezing or shortness of breath.  Arthritis -     diclofenac (VOLTAREN) 75 MG EC tablet; Take 1 tablet (75 mg total) by mouth 2 (two) times daily. -     CBC  Dermatitis -     hydrocortisone 2.5 % cream; Apply 1 application topically 2 (two) times daily.  Need for hepatitis C screening test -     Hepatitis C antibody, reflex  Chronic pain syndrome -     acetaminophen-codeine (TYLENOL #3) 300-30 MG tablet; TAKE 1 TABLET BY MOUTH EVERY 8 HOURS AS NEEDED FOR MODERATE PAIN.   There are no diagnoses linked to this encounter.  No orders of the  defined types were placed in this encounter.   Follow-up: No Follow-up on file.   Boykin Nearing MD

## 2016-01-29 NOTE — Progress Notes (Signed)
Pt wants to be checked for diabetes.  Pt declined flu shot.

## 2016-01-29 NOTE — Assessment & Plan Note (Signed)
Chronic stable Continue valium

## 2016-01-30 ENCOUNTER — Telehealth: Payer: Self-pay

## 2016-01-30 LAB — HEPATITIS C ANTIBODY: HCV AB: NEGATIVE

## 2016-01-30 NOTE — Telephone Encounter (Signed)
Unsuccessful call attempt: received message that voicemail has not been set up yet.  Unable to reach you letter sent.

## 2016-01-30 NOTE — Telephone Encounter (Signed)
-----   Message from Boykin Nearing, MD sent at 01/30/2016  8:34 AM EDT ----- Screening hep C negative All labs normal except for high cholesterol Be sure to take lipitor 20 mg daily  Plan to recheck in 6 months, if still high will need increase in lipitor to 40 mg daily

## 2016-02-04 ENCOUNTER — Telehealth: Payer: Self-pay | Admitting: Family Medicine

## 2016-02-04 NOTE — Telephone Encounter (Signed)
Patient needs lab results. Patient is aware that is normal except cholesterol.

## 2016-02-05 NOTE — Telephone Encounter (Signed)
Return call to patient received message, "voicemail has not been set up yet".  Lipid panel results printed and mailed to patient.  Priscille Heidelberg, RN, BSN

## 2016-03-10 ENCOUNTER — Other Ambulatory Visit: Payer: Self-pay | Admitting: Internal Medicine

## 2016-03-10 DIAGNOSIS — G894 Chronic pain syndrome: Secondary | ICD-10-CM

## 2016-03-11 ENCOUNTER — Other Ambulatory Visit: Payer: Self-pay | Admitting: Internal Medicine

## 2016-03-11 DIAGNOSIS — G894 Chronic pain syndrome: Secondary | ICD-10-CM

## 2016-03-11 NOTE — Telephone Encounter (Signed)
Pt picked up script this morning. 03/11/16

## 2016-03-11 NOTE — Telephone Encounter (Signed)
Please inform patient Tylenol #3 ready for pick up  

## 2016-04-12 ENCOUNTER — Telehealth: Payer: Self-pay | Admitting: Family Medicine

## 2016-04-12 DIAGNOSIS — F419 Anxiety disorder, unspecified: Secondary | ICD-10-CM

## 2016-04-12 MED ORDER — DIAZEPAM 10 MG PO TABS: 5.0000 mg | ORAL_TABLET | Freq: Every evening | ORAL | 3 refills | Status: DC | PRN

## 2016-04-12 NOTE — Telephone Encounter (Signed)
Patient called to request medication refill for diazepam (VALIUM) 10 MG tablet. Please follow up.    Thanks.

## 2016-04-12 NOTE — Telephone Encounter (Signed)
Pt was called but there was no VM set up to leave message. Will try to call pt again letter. Script will be in log book up front.

## 2016-04-12 NOTE — Telephone Encounter (Signed)
Please inform patient Valium Rx ready for pick up

## 2016-05-28 ENCOUNTER — Ambulatory Visit: Payer: Medicaid Other | Admitting: Family Medicine

## 2016-08-06 ENCOUNTER — Encounter: Payer: Self-pay | Admitting: Family Medicine

## 2016-08-06 ENCOUNTER — Ambulatory Visit: Payer: Medicaid Other | Attending: Family Medicine | Admitting: Family Medicine

## 2016-08-06 VITALS — BP 138/84 | HR 94 | Temp 98.3°F | Ht 63.0 in | Wt 151.4 lb

## 2016-08-06 DIAGNOSIS — G894 Chronic pain syndrome: Secondary | ICD-10-CM | POA: Diagnosis not present

## 2016-08-06 DIAGNOSIS — F332 Major depressive disorder, recurrent severe without psychotic features: Secondary | ICD-10-CM | POA: Diagnosis not present

## 2016-08-06 DIAGNOSIS — Z79899 Other long term (current) drug therapy: Secondary | ICD-10-CM | POA: Insufficient documentation

## 2016-08-06 DIAGNOSIS — I10 Essential (primary) hypertension: Secondary | ICD-10-CM | POA: Insufficient documentation

## 2016-08-06 DIAGNOSIS — F17210 Nicotine dependence, cigarettes, uncomplicated: Secondary | ICD-10-CM | POA: Insufficient documentation

## 2016-08-06 DIAGNOSIS — M1990 Unspecified osteoarthritis, unspecified site: Secondary | ICD-10-CM

## 2016-08-06 DIAGNOSIS — E785 Hyperlipidemia, unspecified: Secondary | ICD-10-CM | POA: Diagnosis not present

## 2016-08-06 DIAGNOSIS — F419 Anxiety disorder, unspecified: Secondary | ICD-10-CM | POA: Insufficient documentation

## 2016-08-06 MED ORDER — CYCLOBENZAPRINE HCL 5 MG PO TABS: 5.0000 mg | ORAL_TABLET | Freq: Every day | ORAL | 2 refills | Status: DC

## 2016-08-06 MED ORDER — DICLOFENAC SODIUM 75 MG PO TBEC: 75.0000 mg | DELAYED_RELEASE_TABLET | Freq: Two times a day (BID) | ORAL | 3 refills | Status: AC

## 2016-08-06 MED ORDER — AMLODIPINE BESYLATE 10 MG PO TABS: 10.0000 mg | ORAL_TABLET | Freq: Every day | ORAL | 3 refills | Status: AC

## 2016-08-06 MED ORDER — ATORVASTATIN CALCIUM 20 MG PO TABS: 20.0000 mg | ORAL_TABLET | Freq: Every day | ORAL | 3 refills | Status: AC

## 2016-08-06 MED ORDER — ALPRAZOLAM 1 MG PO TABS: 0.5000 mg | ORAL_TABLET | Freq: Every evening | ORAL | 0 refills | Status: DC | PRN

## 2016-08-06 MED ORDER — ALPRAZOLAM 1 MG PO TABS: 0.5000 mg | ORAL_TABLET | Freq: Every evening | ORAL | 1 refills | Status: DC | PRN

## 2016-08-06 MED ORDER — LISINOPRIL 10 MG PO TABS: 10.0000 mg | ORAL_TABLET | Freq: Every day | ORAL | 3 refills | Status: AC

## 2016-08-06 MED ORDER — CITALOPRAM HYDROBROMIDE 20 MG PO TABS: 20.0000 mg | ORAL_TABLET | Freq: Every day | ORAL | 3 refills | Status: DC

## 2016-08-06 NOTE — Patient Instructions (Addendum)
Felicia Medina was seen today for anxiety.  Diagnoses and all orders for this visit:  Chronic anxiety -     citalopram (CELEXA) 20 MG tablet; Take 1 tablet (20 mg total) by mouth daily. -     Discontinue: ALPRAZolam (XANAX) 1 MG tablet; Take 0.5-1 tablets (0.5-1 mg total) by mouth at bedtime as needed for anxiety. -     ALPRAZolam (XANAX) 1 MG tablet; Take 0.5-1 tablets (0.5-1 mg total) by mouth at bedtime as needed for anxiety.  Severe episode of recurrent major depressive disorder, without psychotic features ([place]) -     citalopram (CELEXA) 20 MG tablet; Take 1 tablet (20 mg total) by mouth daily. -     Discontinue: ALPRAZolam (XANAX) 1 MG tablet; Take 0.5-1 tablets (0.5-1 mg total) by mouth at bedtime as needed for anxiety. -     ALPRAZolam (XANAX) 1 MG tablet; Take 0.5-1 tablets (0.5-1 mg total) by mouth at bedtime as needed for anxiety.  Chronic pain syndrome -     cyclobenzaprine (FLEXERIL) 5 MG tablet; Take 1 tablet (5 mg total) by mouth at bedtime.  Essential hypertension -     amLODipine (NORVASC) 10 MG tablet; Take 1 tablet (10 mg total) by mouth daily. -     lisinopril (PRINIVIL,ZESTRIL) 10 MG tablet; Take 1 tablet (10 mg total) by mouth daily.  Arthritis -     diclofenac (VOLTAREN) 75 MG EC tablet; Take 1 tablet (75 mg total) by mouth 2 (two) times daily.  Hyperlipidemia, unspecified hyperlipidemia type -     atorvastatin (LIPITOR) 20 MG tablet; Take 1 tablet (20 mg total) by mouth daily.   f/u in 6 weeks for anxiety and depression  Dr. Adrian Blackwater
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
## 2016-08-06 NOTE — Assessment & Plan Note (Signed)
A: chronic pain due to osteoarthritis P: Stop tylenol #3 per patient request Add flexeril continue diclofenac
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
## 2016-08-17 ENCOUNTER — Encounter (HOSPITAL_COMMUNITY): Payer: Self-pay | Admitting: Emergency Medicine

## 2016-08-17 ENCOUNTER — Emergency Department (HOSPITAL_COMMUNITY)
Admission: EM | Admit: 2016-08-17 | Discharge: 2016-08-17 | Disposition: A | Discharge status: Home / Self Care | Payer: Medicaid Other | Attending: Emergency Medicine | Admitting: Emergency Medicine

## 2016-08-17 DIAGNOSIS — Z79899 Other long term (current) drug therapy: Secondary | ICD-10-CM | POA: Insufficient documentation

## 2016-08-17 DIAGNOSIS — R5383 Other fatigue: Secondary | ICD-10-CM | POA: Diagnosis not present

## 2016-08-17 DIAGNOSIS — Z72820 Sleep deprivation: Secondary | ICD-10-CM

## 2016-08-17 DIAGNOSIS — Z8541 Personal history of malignant neoplasm of cervix uteri: Secondary | ICD-10-CM | POA: Insufficient documentation

## 2016-08-17 DIAGNOSIS — F17210 Nicotine dependence, cigarettes, uncomplicated: Secondary | ICD-10-CM | POA: Insufficient documentation

## 2016-08-17 DIAGNOSIS — I10 Essential (primary) hypertension: Secondary | ICD-10-CM | POA: Diagnosis not present

## 2016-08-17 DIAGNOSIS — R42 Dizziness and giddiness: Secondary | ICD-10-CM | POA: Diagnosis present

## 2016-08-17 LAB — I-STAT CHEM 8, ED
BUN: 18 mg/dL (ref 6–20)
CHLORIDE: 102 mmol/L (ref 101–111)
Calcium, Ion: 1.19 mmol/L (ref 1.15–1.40)
Creatinine, Ser: 0.6 mg/dL (ref 0.44–1.00)
Glucose, Bld: 108 mg/dL — ABNORMAL HIGH (ref 65–99)
HCT: 47 % — ABNORMAL HIGH (ref 36.0–46.0)
Hemoglobin: 16 g/dL — ABNORMAL HIGH (ref 12.0–15.0)
Potassium: 4.4 mmol/L (ref 3.5–5.1)
SODIUM: 138 mmol/L (ref 135–145)
TCO2: 26 mmol/L (ref 0–100)

## 2016-08-17 MED ORDER — MELATONIN 5 MG PO TABS: 5.0000 mg | ORAL_TABLET | Freq: Every day | ORAL | 0 refills | Status: DC

## 2016-08-17 MED ORDER — MECLIZINE HCL 25 MG PO TABS: 50.0000 mg | ORAL_TABLET | Freq: Three times a day (TID) | ORAL | 0 refills | Status: DC | PRN
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
## 2016-08-17 NOTE — ED Notes (Signed)
Bed: WA11 Expected date:  Expected time:  Means of arrival:  Comments: ems 

## 2016-08-17 NOTE — ED Triage Notes (Signed)
Per [facility] pt reports gl weakness x 5 days, dizziness when she closes her eyes. No extremities weakness nor neuro deficit. Alert and oriented x 4. Hx HTN. No headache nor vision changes. Mid back pain , hx arthritis.

## 2016-08-28 ENCOUNTER — Encounter (HOSPITAL_COMMUNITY): Payer: Self-pay

## 2016-08-28 ENCOUNTER — Emergency Department (HOSPITAL_COMMUNITY)
Admission: EM | Admit: 2016-08-28 | Discharge: 2016-08-28 | Disposition: A | Discharge status: Home / Self Care | Payer: Medicaid Other | Attending: Emergency Medicine | Admitting: Emergency Medicine

## 2016-08-28 DIAGNOSIS — Z8541 Personal history of malignant neoplasm of cervix uteri: Secondary | ICD-10-CM | POA: Diagnosis not present

## 2016-08-28 DIAGNOSIS — G4709 Other insomnia: Secondary | ICD-10-CM | POA: Insufficient documentation

## 2016-08-28 DIAGNOSIS — Z79899 Other long term (current) drug therapy: Secondary | ICD-10-CM | POA: Insufficient documentation

## 2016-08-28 DIAGNOSIS — I10 Essential (primary) hypertension: Secondary | ICD-10-CM | POA: Insufficient documentation

## 2016-08-28 DIAGNOSIS — F17210 Nicotine dependence, cigarettes, uncomplicated: Secondary | ICD-10-CM | POA: Insufficient documentation

## 2016-08-28 DIAGNOSIS — F419 Anxiety disorder, unspecified: Secondary | ICD-10-CM | POA: Diagnosis present

## 2016-08-28 LAB — CBC WITH DIFFERENTIAL/PLATELET
BASOS ABS: 0 10*3/uL (ref 0.0–0.1)
BASOS PCT: 0 %
Eosinophils Absolute: 0.1 10*3/uL (ref 0.0–0.7)
Eosinophils Relative: 1 %
HEMATOCRIT: 45.4 % (ref 36.0–46.0)
HEMOGLOBIN: 14.9 g/dL (ref 12.0–15.0)
Lymphocytes Relative: 27 %
Lymphs Abs: 2.6 10*3/uL (ref 0.7–4.0)
MCH: 29.4 pg (ref 26.0–34.0)
MCHC: 32.8 g/dL (ref 30.0–36.0)
MCV: 89.5 fL (ref 78.0–100.0)
MONOS PCT: 4 %
Monocytes Absolute: 0.3 10*3/uL (ref 0.1–1.0)
NEUTROS ABS: 6.5 10*3/uL (ref 1.7–7.7)
NEUTROS PCT: 68 %
Platelets: 291 10*3/uL (ref 150–400)
RBC: 5.07 MIL/uL (ref 3.87–5.11)
RDW: 12.8 % (ref 11.5–15.5)
WBC: 9.6 10*3/uL (ref 4.0–10.5)

## 2016-08-28 LAB — COMPREHENSIVE METABOLIC PANEL
ALK PHOS: 81 U/L (ref 38–126)
ALT: 26 U/L (ref 14–54)
ANION GAP: 16 — AB (ref 5–15)
AST: 24 U/L (ref 15–41)
Albumin: 4.7 g/dL (ref 3.5–5.0)
BILIRUBIN TOTAL: 0.5 mg/dL (ref 0.3–1.2)
BUN: 14 mg/dL (ref 6–20)
CALCIUM: 10.4 mg/dL — AB (ref 8.9–10.3)
CO2: 22 mmol/L (ref 22–32)
Chloride: 102 mmol/L (ref 101–111)
Creatinine, Ser: 0.74 mg/dL (ref 0.44–1.00)
Glucose, Bld: 176 mg/dL — ABNORMAL HIGH (ref 65–99)
Potassium: 4.1 mmol/L (ref 3.5–5.1)
SODIUM: 140 mmol/L (ref 135–145)
Total Protein: 7.6 g/dL (ref 6.5–8.1)

## 2016-08-28 LAB — RAPID URINE DRUG SCREEN, HOSP PERFORMED
Amphetamines: NOT DETECTED
BARBITURATES: NOT DETECTED
BENZODIAZEPINES: POSITIVE — AB
COCAINE: NOT DETECTED
OPIATES: NOT DETECTED
TETRAHYDROCANNABINOL: NOT DETECTED

## 2016-08-28 LAB — ETHANOL

## 2016-08-28 MED ORDER — LORAZEPAM 1 MG PO TABS
ORAL_TABLET | ORAL | Status: AC
  Filled 2016-08-28: qty 1

## 2016-08-28 MED ORDER — LORAZEPAM 1 MG PO TABS
1.0000 mg | ORAL_TABLET | Freq: Once | ORAL | Status: AC
Start: 2016-08-28 — End: 2016-08-28
  Administered 2016-08-28: 1 mg via ORAL
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
## 2016-08-28 NOTE — Discharge Instructions (Addendum)
As discussed, please contact your primary care provider to see if she can call in a new prescription to replace Celexa.
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
## 2016-08-30 ENCOUNTER — Telehealth: Payer: Self-pay | Admitting: Family Medicine

## 2016-08-30 NOTE — Telephone Encounter (Signed)
Pt. Cousin called stating that pt. Went to the ED over the weekend. She would like to speak with pt. PCP regarding a medication that was prescribed to the pt. Pt. Cousin did not give me the name of the medication. Please f/u

## 2016-09-01 NOTE — Telephone Encounter (Signed)
Will route to PCP 

## 2016-09-02 ENCOUNTER — Emergency Department (HOSPITAL_COMMUNITY)
Admission: EM | Admit: 2016-09-02 | Discharge: 2016-09-02 | Disposition: A | Discharge status: Home / Self Care | Payer: Medicaid Other | Attending: Emergency Medicine | Admitting: Emergency Medicine

## 2016-09-02 ENCOUNTER — Emergency Department (HOSPITAL_COMMUNITY): Payer: Medicaid Other

## 2016-09-02 ENCOUNTER — Encounter (HOSPITAL_COMMUNITY): Payer: Self-pay

## 2016-09-02 DIAGNOSIS — F17210 Nicotine dependence, cigarettes, uncomplicated: Secondary | ICD-10-CM | POA: Insufficient documentation

## 2016-09-02 DIAGNOSIS — R5383 Other fatigue: Secondary | ICD-10-CM | POA: Diagnosis present

## 2016-09-02 DIAGNOSIS — I10 Essential (primary) hypertension: Secondary | ICD-10-CM | POA: Insufficient documentation

## 2016-09-02 DIAGNOSIS — R319 Hematuria, unspecified: Secondary | ICD-10-CM | POA: Insufficient documentation

## 2016-09-02 DIAGNOSIS — R809 Proteinuria, unspecified: Secondary | ICD-10-CM | POA: Diagnosis not present

## 2016-09-02 DIAGNOSIS — Z79899 Other long term (current) drug therapy: Secondary | ICD-10-CM | POA: Insufficient documentation

## 2016-09-02 DIAGNOSIS — Z8541 Personal history of malignant neoplasm of cervix uteri: Secondary | ICD-10-CM | POA: Diagnosis not present

## 2016-09-02 LAB — URINALYSIS, ROUTINE W REFLEX MICROSCOPIC
BILIRUBIN URINE: NEGATIVE
Glucose, UA: NEGATIVE mg/dL
KETONES UR: NEGATIVE mg/dL
Leukocytes, UA: NEGATIVE
NITRITE: NEGATIVE
PH: 5 (ref 5.0–8.0)
PROTEIN: 100 mg/dL — AB
SQUAMOUS EPITHELIAL / LPF: NONE SEEN
Specific Gravity, Urine: 1.024 (ref 1.005–1.030)

## 2016-09-02 LAB — CBC
HEMATOCRIT: 46.9 % — AB (ref 36.0–46.0)
Hemoglobin: 15.6 g/dL — ABNORMAL HIGH (ref 12.0–15.0)
MCH: 30.3 pg (ref 26.0–34.0)
MCHC: 33.3 g/dL (ref 30.0–36.0)
MCV: 91.1 fL (ref 78.0–100.0)
Platelets: 301 10*3/uL (ref 150–400)
RBC: 5.15 MIL/uL — ABNORMAL HIGH (ref 3.87–5.11)
RDW: 12.7 % (ref 11.5–15.5)
WBC: 9.1 10*3/uL (ref 4.0–10.5)

## 2016-09-02 LAB — COMPREHENSIVE METABOLIC PANEL
ALBUMIN: 4.6 g/dL (ref 3.5–5.0)
ALK PHOS: 66 U/L (ref 38–126)
ALT: 33 U/L (ref 14–54)
AST: 28 U/L (ref 15–41)
Anion gap: 12 (ref 5–15)
BILIRUBIN TOTAL: 0.3 mg/dL (ref 0.3–1.2)
BUN: 11 mg/dL (ref 6–20)
CO2: 22 mmol/L (ref 22–32)
Calcium: 10 mg/dL (ref 8.9–10.3)
Chloride: 105 mmol/L (ref 101–111)
Creatinine, Ser: 0.55 mg/dL (ref 0.44–1.00)
GFR calc Af Amer: >60 mL/min (ref >60)
GFR calc non Af Amer: >60 mL/min (ref >60)
GLUCOSE: 119 mg/dL — AB (ref 65–99)
POTASSIUM: 3.8 mmol/L (ref 3.5–5.1)
Sodium: 139 mmol/L (ref 135–145)
TOTAL PROTEIN: 7.3 g/dL (ref 6.5–8.1)

## 2016-09-02 LAB — LIPASE, BLOOD: Lipase: 22 U/L (ref 11–51)
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
## 2016-09-02 NOTE — ED Notes (Signed)
Gave pt drink and crackers, per nurse first.

## 2016-09-02 NOTE — ED Notes (Signed)
Patient transported to CT 

## 2016-09-02 NOTE — Care Management (Signed)
Noted patient to have had 3 ED visits in the last 2 weeks.  Patient also noted to be followed at the [facility], PCP Dr. Adrian Blackwater. Last office visit was 08/06/16.  Patient contacted [facility] regarding medication questions. CM noted message from Provider, for office to contact patient to follow up regarding patient's concerns. CM met with patient at bedside to confirmed information. She reports she had to come to ED because she could not get in-touch with her PCP. CM discussed that it was noted that her PCP addressed her concerns and the office will respond to address her concerns .  Patient verbalized understanding, CM will also contact [facility] CM to follow up.  No further ED CM needs identified.

## 2016-09-02 NOTE — ED Notes (Signed)
Florene Glen, NP at bedside at this time.

## 2016-09-02 NOTE — Discharge Instructions (Signed)
°  Please make an appointment with your primary care physician for follow-up as indicated below.  There is a prominence of the left adrenal gland that will need to be followed with specific labs and perhaps further imaging .  Asymmetric prominence of the left adrenal gland. This may reflect an underlying nodule.

## 2016-09-02 NOTE — ED Triage Notes (Signed)
Pt presents to the ed with multiple complaints.  States she has been coming here and [facility] for a month for complaints of decreased appetite, severe fatigue.  Every thing has been coming back normal and she was given ativan with no relief.  Over the last two days she has been having upper abdominal pain with dark stools.  She is tired of feeling this way and wants to know what is wrong.

## 2016-09-02 NOTE — ED Provider Notes (Signed)
All of blood.  CT scan shows that she has several stones within the renal pole, but no hydronephrosis or stones within the ureter or bladder is noted that there is no adrenal mass that will need to be followed up by her primary care physician with specific labs and perhaps outpatient imaging   Junius Creamer, NP 09/02/16 2251    Lacretia Leigh, MD 09/02/16 2318

## 2016-09-02 NOTE — Telephone Encounter (Signed)
Please call back for more info If needed RN or pharmacy should be able to assist

## 2016-09-06 ENCOUNTER — Telehealth: Payer: Self-pay

## 2016-09-06 NOTE — Telephone Encounter (Signed)
Message received from [place], RN CM noting that the patient has not been able to reach Dr Adrian Blackwater.  Informed Wendi Maya that the patient has an appointment scheduled with Dr Adrian Blackwater on 09/09/16.

## 2016-09-08 ENCOUNTER — Encounter: Payer: Self-pay | Admitting: Family Medicine

## 2016-09-08 NOTE — Progress Notes (Signed)
Subjective:  Patient ID: Felicia Medina, female    DOB: [birth year]  Age: 61 y.o. MRN: [id number]  CC: Hospitalization Follow-up   HPI Felicia Medina presents for   1. Anxiety: worsened 8 months ago following move to a new apartment and having issues with her neighbor. She reports feeling unsafe and anxious at home. Trouble sleeping due to loud music from her downstairs neighbor. She reports depression. At our last visit she was restarted on Celexa 20 mg and xanax.   After one week of Celexa 20 mg daily she developed decreased appetite, trouble sleeping and reported needing a cane.  She reports feeling better. She is still having some anxiety and trouble sleeping. She request increasing the trazodone and possible increasing xanax or going back to valium.   Since then she has gone to the ED 3 times for fatigue and insomnia. Her most recent ED visit was on 09/02/16. She was noted to have proteinuria and hematuria. F/u ct renal stone study was obtained and revealed: IMPRESSION: 1. No evidence of hydronephrosis. 2. Nonobstructing bilateral renal stones, including a 1.1 cm stone at the left renal pelvis. 3. Scattered aortic atherosclerosis. 4. Asymmetric prominence of the left adrenal gland. This may reflect an underlying nodule. Would correlate with adrenal labs, and consider adrenal protocol MRI or CT for further evaluation, as deemed clinically appropriate.  Otherwise her work up was negative.  On 08/28/16 her UDS was positive for benzos, otherwise negative.    2. Chronic pain in shoulders: declined. She has known osteoarthritis.  She reports tylenol #3  and flexeril has not helped. So this was discontinued.  She has pain when she sleeps on her R side. She takes ibuprofen 400 mg twice daily up to 4 times daily ([phone] mg daily)   Social History  Substance Use Topics  . Smoking status: Current Every Day Smoker    Packs/day: 0.33    Types: Cigarettes  . Smokeless tobacco: Never Used    . Alcohol use No   Outpatient Medications Prior to Visit  Medication Sig Dispense Refill  . albuterol (PROVENTIL HFA;VENTOLIN HFA) 108 (90 Base) MCG/ACT inhaler Inhale 2 puffs into the lungs every 6 (six) hours as needed for wheezing or shortness of breath. 1 Inhaler 3  . ALPRAZolam (XANAX) 1 MG tablet Take 0.5-1 tablets (0.5-1 mg total) by mouth at bedtime as needed for anxiety. (Patient not taking: Reported on 08/28/2016) 30 tablet 1  . amLODipine (NORVASC) 10 MG tablet Take 1 tablet (10 mg total) by mouth daily. 90 tablet 3  . atorvastatin (LIPITOR) 20 MG tablet Take 1 tablet (20 mg total) by mouth daily. 90 tablet 3  . citalopram (CELEXA) 20 MG tablet Take 1 tablet (20 mg total) by mouth daily. (Patient not taking: Reported on 08/28/2016) 30 tablet 3  . cyclobenzaprine (FLEXERIL) 5 MG tablet Take 1 tablet (5 mg total) by mouth at bedtime. (Patient not taking: Reported on 08/28/2016) 30 tablet 2  . diazepam (VALIUM) 10 MG tablet Take 5-10 mg by mouth at bedtime as needed for sleep.  3  . diclofenac (VOLTAREN) 75 MG EC tablet Take 1 tablet (75 mg total) by mouth 2 (two) times daily. 180 tablet 3  . hydrocortisone 2.5 % cream Apply 1 application topically 2 (two) times daily. 30 g 11  . lisinopril (PRINIVIL,ZESTRIL) 10 MG tablet Take 1 tablet (10 mg total) by mouth daily. 90 tablet 3  . meclizine (ANTIVERT) 25 MG tablet Take 2 tablets (50 mg total)  by mouth 3 (three) times daily as needed for dizziness. (Patient not taking: Reported on 08/28/2016) 30 tablet 0  . Melatonin 5 MG TABS Take 1 tablet (5 mg total) by mouth at bedtime. (Patient not taking: Reported on 08/28/2016) 30 tablet 0   No facility-administered medications prior to visit.     ROS Review of Systems  Constitutional: Negative for chills and fever.  Eyes: Negative for visual disturbance.  Respiratory: Negative for shortness of breath.   Cardiovascular: Negative for chest pain.  Gastrointestinal: Negative for abdominal pain and blood  in stool.  Genitourinary: Negative for frequency.  Musculoskeletal: Positive for arthralgias and joint swelling. Negative for back pain.  Skin: Negative for rash.  Allergic/Immunologic: Negative for immunocompromised state.  Hematological: Negative for adenopathy. Does not bruise/bleed easily.  Psychiatric/Behavioral: Positive for dysphoric mood and sleep disturbance. Negative for self-injury and suicidal ideas. The patient is nervous/anxious.     Objective:  BP 128/84   Pulse 82   Temp 97.5 F (36.4 C) (Oral)   Ht 5\' 3"  (1.6 m)   Wt 153 lb 3.2 oz (69.5 kg)   SpO2 94%   BMI 27.14 kg/m   BP/Weight 09/09/2016 12/28/90 06/27/74  Systolic BP 226 333 545  Diastolic BP 84 76 86  Wt. (Lbs) 153.2 150 -  BMI 27.14 26.57 -   Physical Exam  Constitutional: She is oriented to person, place, and time. She appears well-developed and well-nourished. No distress.  HENT:  Head: Normocephalic and atraumatic.  Mouth/Throat: She has dentures.  Cardiovascular: Normal rate, regular rhythm, normal heart sounds and intact distal pulses.   Pulmonary/Chest: Effort normal and breath sounds normal. No respiratory distress. She has no wheezes.  Abdominal: Soft. Bowel sounds are normal. She exhibits no distension and no mass. There is no tenderness. There is no rebound, no guarding and no CVA tenderness.  Musculoskeletal: She exhibits no edema.       Arms: Neurological: She is alert and oriented to person, place, and time.  Skin: Skin is warm and dry. No rash noted.  Psychiatric: She has a normal mood and affect. Her speech is normal and behavior is normal. Judgment and thought content normal. Cognition and memory are normal.   Depression screen [facility] 2/9 08/06/2016 01/29/2016 09/02/2015  Decreased Interest 3 0 1  Down, Depressed, Hopeless 3 0 0  PHQ - 2 Score 6 0 1  Altered sleeping 3 3 3   Tired, decreased energy 3 2 0  Change in appetite 3 0 0  Feeling bad or failure about yourself  3 0 0  Trouble  concentrating 3 3 3   Moving slowly or fidgety/restless 1 0 0  Suicidal thoughts 0 0 0  PHQ-9 Score 22 8 7    GAD 7 : Generalized Anxiety Score 08/06/2016 01/29/2016 09/02/2015 06/03/2015  Nervous, Anxious, on Edge 3 3 0 2  Control/stop worrying 3 3 3 2   Worry too much - different things 3 3 3 2   Trouble relaxing 3 3 3 2   Restless 3 3 3 2   Easily annoyed or irritable 3 3 3 2   Afraid - awful might happen 3 3 0 2  Total GAD 7 Score 21 21 15 14     UA: moderate blood, small LE   Assessment & Plan:  Talita was seen today for hospitalization follow-up.  Diagnoses and all orders for this visit:  Adrenal gland anomaly -     ACTH -     DHEA-Sulfate, Serum -     Cortisol-am, blood;  Future -     dexamethasone (DECADRON) 1 MG tablet; Take 1 tablet (1 mg total) by mouth once. Between 11 PM and 12 AM for 8 AM cortisol check -     POCT urinalysis dipstick  Chronic anxiety -     diazepam (VALIUM) 10 MG tablet; Take 0.5-1 tablets (5-10 mg total) by mouth at bedtime as needed for sleep. -     traZODone (DESYREL) 50 MG tablet; Take by mouth start with 50 mg nightly for one week, then 100 mg nightly for one week, then 150 mg nightly  Depression, unspecified depression type -     diazepam (VALIUM) 10 MG tablet; Take 0.5-1 tablets (5-10 mg total) by mouth at bedtime as needed for sleep. -     traZODone (DESYREL) 50 MG tablet; Take by mouth start with 50 mg nightly for one week, then 100 mg nightly for one week, then 150 mg nightly  Chronic pain syndrome -     ibuprofen (ADVIL,MOTRIN) 600 MG tablet; Take 1 tablet (600 mg total) by mouth 2 (two) times daily.  Arthritis -     ibuprofen (ADVIL,MOTRIN) 600 MG tablet; Take 1 tablet (600 mg total) by mouth 2 (two) times daily.  Visit for screening mammogram -     MM DIGITAL SCREENING BILATERAL; Future   There are no diagnoses linked to this encounter.  No orders of the defined types were placed in this encounter.   Follow-up: Return in about 3 weeks  (around 09/30/2016) for anxiety, insomnia.   Boykin Nearing MD

## 2016-09-09 ENCOUNTER — Encounter: Payer: Self-pay | Admitting: Family Medicine

## 2016-09-09 ENCOUNTER — Ambulatory Visit: Payer: Medicaid Other | Attending: Family Medicine | Admitting: Family Medicine

## 2016-09-09 VITALS — BP 128/84 | HR 82 | Temp 97.5°F | Ht 63.0 in | Wt 153.2 lb

## 2016-09-09 DIAGNOSIS — Q891 Congenital malformations of adrenal gland: Secondary | ICD-10-CM

## 2016-09-09 DIAGNOSIS — F329 Major depressive disorder, single episode, unspecified: Secondary | ICD-10-CM | POA: Diagnosis not present

## 2016-09-09 DIAGNOSIS — F17210 Nicotine dependence, cigarettes, uncomplicated: Secondary | ICD-10-CM | POA: Diagnosis not present

## 2016-09-09 DIAGNOSIS — Z1231 Encounter for screening mammogram for malignant neoplasm of breast: Secondary | ICD-10-CM

## 2016-09-09 DIAGNOSIS — R319 Hematuria, unspecified: Secondary | ICD-10-CM | POA: Diagnosis not present

## 2016-09-09 DIAGNOSIS — F32A Depression, unspecified: Secondary | ICD-10-CM

## 2016-09-09 DIAGNOSIS — E279 Disorder of adrenal gland, unspecified: Secondary | ICD-10-CM | POA: Insufficient documentation

## 2016-09-09 DIAGNOSIS — F419 Anxiety disorder, unspecified: Secondary | ICD-10-CM | POA: Diagnosis present

## 2016-09-09 DIAGNOSIS — M1990 Unspecified osteoarthritis, unspecified site: Secondary | ICD-10-CM | POA: Diagnosis not present

## 2016-09-09 DIAGNOSIS — F5104 Psychophysiologic insomnia: Secondary | ICD-10-CM | POA: Diagnosis not present

## 2016-09-09 DIAGNOSIS — G894 Chronic pain syndrome: Secondary | ICD-10-CM

## 2016-09-09 DIAGNOSIS — Z79899 Other long term (current) drug therapy: Secondary | ICD-10-CM | POA: Insufficient documentation

## 2016-09-09 LAB — POCT URINALYSIS DIPSTICK
BILIRUBIN UA: NEGATIVE
Glucose, UA: NEGATIVE
KETONES UA: NEGATIVE
NITRITE UA: NEGATIVE
PH UA: 5.5 (ref 5.0–8.0)
PROTEIN UA: NEGATIVE
Spec Grav, UA: 1.025 (ref 1.010–1.025)
Urobilinogen, UA: 0.2 E.U./dL

## 2016-09-09 MED ORDER — TRAZODONE HCL 50 MG PO TABS: ORAL_TABLET | ORAL | 0 refills | Status: DC

## 2016-09-09 MED ORDER — DEXAMETHASONE 1 MG PO TABS: 1.0000 mg | ORAL_TABLET | Freq: Once | ORAL | 0 refills | Status: AC

## 2016-09-09 MED ORDER — IBUPROFEN 600 MG PO TABS: 600.0000 mg | ORAL_TABLET | Freq: Two times a day (BID) | ORAL | 2 refills | Status: DC

## 2016-09-09 MED ORDER — DIAZEPAM 10 MG PO TABS: 5.0000 mg | ORAL_TABLET | Freq: Every evening | ORAL | 3 refills | Status: DC | PRN

## 2016-09-09 NOTE — Assessment & Plan Note (Signed)
Chronic osteoarthritic pain Patient  Taking tylenol On benzo Avoiding mixing benzo and opioid continue ibuprofen

## 2016-09-09 NOTE — Patient Instructions (Addendum)
Felicia Medina was seen today for hospitalization follow-up.  Diagnoses and all orders for this visit:  Adrenal gland anomaly -     ACTH -     DHEA-Sulfate, Serum -     Cortisol-am, blood; Future -     dexamethasone (DECADRON) 1 MG tablet; Take 1 tablet (1 mg total) by mouth once. Between 11 PM and 12 AM for 8 AM cortisol check -     POCT urinalysis dipstick  Chronic anxiety -     diazepam (VALIUM) 10 MG tablet; Take 0.5-1 tablets (5-10 mg total) by mouth at bedtime as needed for sleep. -     traZODone (DESYREL) 50 MG tablet; Take by mouth start with 50 mg nightly for one week, then 100 mg nightly for one week, then 150 mg nightly  Depression, unspecified depression type -     diazepam (VALIUM) 10 MG tablet; Take 0.5-1 tablets (5-10 mg total) by mouth at bedtime as needed for sleep. -     traZODone (DESYREL) 50 MG tablet; Take by mouth start with 50 mg nightly for one week, then 100 mg nightly for one week, then 150 mg nightly  Chronic pain syndrome -     ibuprofen (ADVIL,MOTRIN) 600 MG tablet; Take 1 tablet (600 mg total) by mouth 2 (two) times daily.  Arthritis -     ibuprofen (ADVIL,MOTRIN) 600 MG tablet; Take 1 tablet (600 mg total) by mouth 2 (two) times daily.  Visit for screening mammogram -     MM DIGITAL SCREENING BILATERAL; Future   Please schedule 8 AM cortisol check for tomorrow or early next week. Take the 1 mg dexamethasone at 11 pm- 12 am the night before the blood draw.  F/u with me in 3-4 weeks for anxiety and insomnia   Dr. Adrian Blackwater

## 2016-09-09 NOTE — Assessment & Plan Note (Signed)
Persistent hematuria with small LE Non obstructive stones and CT renal stone study Plan: Urine micro and culture

## 2016-09-09 NOTE — Assessment & Plan Note (Signed)
Chronic anxiety with insomnia Unemployed Stressed about low income  Plan: Restart valium, 5-10 mg nightly Restart trazodone with taper from 50 to 150 mg nightly

## 2016-09-09 NOTE — Assessment & Plan Note (Signed)
L adrenal gland prominence noted on CT renal stone study incidental finding No cushingoid symptoms  Plan: DHEA-S, ACTH and 1 mg dexamethasone suppression test to evaluate

## 2016-09-13 LAB — DHEA-SULFATE, SERUM: DHEA: 21 ug/dL

## 2016-09-13 LAB — ACTH: ACTH: 22.7 pg/mL (ref 7.2–63.3)

## 2016-09-14 ENCOUNTER — Ambulatory Visit: Payer: Medicaid Other | Attending: Family Medicine

## 2016-09-14 DIAGNOSIS — Q891 Congenital malformations of adrenal gland: Secondary | ICD-10-CM | POA: Diagnosis not present

## 2016-09-14 NOTE — Progress Notes (Signed)
Patient her for lab visit only 

## 2016-09-15 LAB — CORTISOL-AM, BLOOD: Cortisol - AM: 1.2 ug/dL — ABNORMAL LOW (ref 6.2–19.4)

## 2016-09-16 ENCOUNTER — Ambulatory Visit: Payer: Medicaid Other | Admitting: Family Medicine

## 2016-09-22 ENCOUNTER — Telehealth: Payer: Self-pay

## 2016-09-22 NOTE — Telephone Encounter (Signed)
Pt was called and VM was not set up to leave a message.

## 2016-09-29 ENCOUNTER — Telehealth: Payer: Self-pay

## 2016-09-29 NOTE — Telephone Encounter (Signed)
Pt was called and VM is not set up to leave a message.

## 2016-10-07 ENCOUNTER — Encounter: Payer: Self-pay | Admitting: Family Medicine

## 2016-10-07 ENCOUNTER — Ambulatory Visit: Payer: Medicaid Other | Attending: Family Medicine | Admitting: Family Medicine

## 2016-10-07 VITALS — BP 107/69 | HR 74 | Temp 98.1°F | Wt 156.6 lb

## 2016-10-07 DIAGNOSIS — F419 Anxiety disorder, unspecified: Secondary | ICD-10-CM | POA: Insufficient documentation

## 2016-10-07 DIAGNOSIS — E7800 Pure hypercholesterolemia, unspecified: Secondary | ICD-10-CM | POA: Diagnosis not present

## 2016-10-07 DIAGNOSIS — F17210 Nicotine dependence, cigarettes, uncomplicated: Secondary | ICD-10-CM | POA: Diagnosis not present

## 2016-10-07 DIAGNOSIS — F329 Major depressive disorder, single episode, unspecified: Secondary | ICD-10-CM | POA: Insufficient documentation

## 2016-10-07 DIAGNOSIS — Z79899 Other long term (current) drug therapy: Secondary | ICD-10-CM | POA: Insufficient documentation

## 2016-10-07 DIAGNOSIS — G4700 Insomnia, unspecified: Secondary | ICD-10-CM | POA: Insufficient documentation

## 2016-10-07 DIAGNOSIS — R140 Abdominal distension (gaseous): Secondary | ICD-10-CM | POA: Diagnosis present

## 2016-10-07 DIAGNOSIS — F32A Depression, unspecified: Secondary | ICD-10-CM

## 2016-10-07 LAB — POCT GLYCOSYLATED HEMOGLOBIN (HGB A1C): Hemoglobin A1C: 5.8

## 2016-10-07 LAB — HEMOCCULT GUIAC POC 1CARD (OFFICE): Fecal Occult Blood, POC: NEGATIVE

## 2016-10-07 MED ORDER — QUETIAPINE FUMARATE 50 MG PO TABS: 50.0000 mg | ORAL_TABLET | Freq: Every day | ORAL | 0 refills | Status: DC

## 2016-10-07 MED ORDER — DIAZEPAM 10 MG PO TABS: 10.0000 mg | ORAL_TABLET | Freq: Two times a day (BID) | ORAL | 3 refills | Status: AC

## 2016-10-07 MED ORDER — RANITIDINE HCL 150 MG PO TABS: 150.0000 mg | ORAL_TABLET | Freq: Two times a day (BID) | ORAL | 1 refills | Status: DC

## 2016-10-07 NOTE — Patient Instructions (Addendum)
Felicia Medina was seen today for follow-up.  Diagnoses and all orders for this visit:  Abdominal bloating -     Hemoccult - 1 Card (office) -     ranitidine (ZANTAC) 150 MG tablet; Take 1 tablet (150 mg total) by mouth 2 (two) times daily.  Chronic anxiety -     diazepam (VALIUM) 10 MG tablet; Take 1 tablet (10 mg total) by mouth 2 (two) times daily. -     QUEtiapine (SEROQUEL) 50 MG tablet; Take 1 tablet (50 mg total) by mouth at bedtime. -     Lipid Panel -     TSH -     POCT glycosylated hemoglobin (Hb A1C)  Depression, unspecified depression type -     diazepam (VALIUM) 10 MG tablet; Take 1 tablet (10 mg total) by mouth 2 (two) times daily. -     QUEtiapine (SEROQUEL) 50 MG tablet; Take 1 tablet (50 mg total) by mouth at bedtime. -     Lipid Panel -     TSH -     POCT glycosylated hemoglobin (Hb A1C)   Taper down on trazdone to none Start seroquel 50 mg nightly Continue valium 10 mg nightly and daytime as needed  F/u in 3 weeks for depression, anxiety and insomnia  Dr. Adrian Blackwater

## 2016-10-07 NOTE — Progress Notes (Signed)
Subjective:  Patient ID: Felicia Medina, female    DOB: August 06, 1955  Age: 61 y.o. MRN: [id number]  CC: Follow-up   HPI Felicia Medina presents for   1. Anxiety: worsened 8 months ago following move to a new apartment and having issues with her neighbor. She reports feeling unsafe and anxious at home. Trouble sleeping due to loud music from her downstairs neighbor. She reports depression. At our last visit she was restarted on Celexa 20 mg and xanax.   After one week of Celexa 20 mg daily she developed decreased appetite, trouble sleeping and reported needing a cane.  She reports feeling better. She is still having some anxiety and trouble sleeping. She request increasing the trazodone and possible increasing xanax or going back to valium.   Since then she has gone to the ED 3 times for fatigue and insomnia. Her most recent ED visit was on 09/02/16. She was noted to have proteinuria and hematuria. F/u ct renal stone study was obtained and revealed: IMPRESSION: 1. No evidence of hydronephrosis. 2. Nonobstructing bilateral renal stones, including a 1.1 cm stone at the left renal pelvis. 3. Scattered aortic atherosclerosis. 4. Asymmetric prominence of the left adrenal gland. This may reflect an underlying nodule. Would correlate with adrenal labs, and consider adrenal protocol MRI or CT for further evaluation, as deemed clinically appropriate.  Otherwise her work up was negative.  On 08/28/16 her UDS was positive for benzos, otherwise negative.  She is taking trazodone 150 mg nightly and valium 10 mg nightly. She still endorses high anxiety and trouble sleeping. She request higher dose of valium, she request latuda. She declines referral to psychiatry.   2. Asymmetric prominence of the left adrenal gland: nted on CT renal stone study. Follow labs including cortisol suppression test were normal.    Social History  Substance Use Topics  . Smoking status: Light Tobacco Smoker    Packs/day:  0.33    Types: Cigarettes  . Smokeless tobacco: Never Used  . Alcohol use No   Outpatient Medications Prior to Visit  Medication Sig Dispense Refill  . albuterol (PROVENTIL HFA;VENTOLIN HFA) 108 (90 Base) MCG/ACT inhaler Inhale 2 puffs into the lungs every 6 (six) hours as needed for wheezing or shortness of breath. 1 Inhaler 3  . amLODipine (NORVASC) 10 MG tablet Take 1 tablet (10 mg total) by mouth daily. 90 tablet 3  . atorvastatin (LIPITOR) 20 MG tablet Take 1 tablet (20 mg total) by mouth daily. 90 tablet 3  . diazepam (VALIUM) 10 MG tablet Take 0.5-1 tablets (5-10 mg total) by mouth at bedtime as needed for sleep. 30 tablet 3  . diclofenac (VOLTAREN) 75 MG EC tablet Take 1 tablet (75 mg total) by mouth 2 (two) times daily. 180 tablet 3  . hydrocortisone 2.5 % cream Apply 1 application topically 2 (two) times daily. 30 g 11  . ibuprofen (ADVIL,MOTRIN) 600 MG tablet Take 1 tablet (600 mg total) by mouth 2 (two) times daily. 60 tablet 2  . lisinopril (PRINIVIL,ZESTRIL) 10 MG tablet Take 1 tablet (10 mg total) by mouth daily. 90 tablet 3  . traZODone (DESYREL) 50 MG tablet Take by mouth start with 50 mg nightly for one week, then 100 mg nightly for one week, then 150 mg nightly 90 tablet 0   No facility-administered medications prior to visit.     ROS Review of Systems  Constitutional: Negative for chills and fever.  Eyes: Negative for visual disturbance.  Respiratory: Negative for  shortness of breath.   Cardiovascular: Negative for chest pain.  Gastrointestinal: Negative for abdominal pain and blood in stool.  Genitourinary: Negative for frequency.  Musculoskeletal: Positive for arthralgias and joint swelling. Negative for back pain.  Skin: Negative for rash.  Allergic/Immunologic: Negative for immunocompromised state.  Hematological: Negative for adenopathy. Does not bruise/bleed easily.  Psychiatric/Behavioral: Positive for dysphoric mood and sleep disturbance. Negative for  self-injury and suicidal ideas. The patient is nervous/anxious.     Objective:  BP 107/69   Pulse 74   Temp 98.1 F (36.7 C) (Oral)   Wt 156 lb 9.6 oz (71 kg)   SpO2 95%   BMI 27.74 kg/m   BP/Weight 10/07/2016 09/09/2016 6/31/4970  Systolic BP 263 785 885  Diastolic BP 69 84 76  Wt. (Lbs) 156.6 153.2 150  BMI 27.74 27.14 26.57   Physical Exam  Constitutional: She is oriented to person, place, and time. She appears well-developed and well-nourished. No distress.  HENT:  Head: Normocephalic and atraumatic.  Mouth/Throat: She has dentures.  Cardiovascular: Normal rate, regular rhythm, normal heart sounds and intact distal pulses.   Pulmonary/Chest: Effort normal and breath sounds normal. No respiratory distress. She has no wheezes.  Abdominal: Soft. Bowel sounds are normal. She exhibits no distension and no mass. There is no tenderness. There is no rebound, no guarding and no CVA tenderness.  Musculoskeletal: She exhibits no edema.       Arms: Neurological: She is alert and oriented to person, place, and time.  Skin: Skin is warm and dry. No rash noted.  Psychiatric: She has a normal mood and affect. Her speech is normal and behavior is normal. Judgment and thought content normal. Cognition and memory are normal.   Lab Results  Component Value Date   HGBA1C 6.0 01/29/2016    Depression screen [facility] 2/9 10/07/2016 09/09/2016 08/06/2016  Decreased Interest 3 3 3   Down, Depressed, Hopeless 3 3 3   PHQ - 2 Score 6 6 6   Altered sleeping 3 3 3   Tired, decreased energy 3 3 3   Change in appetite 3 3 3   Feeling bad or failure about yourself  3 3 3   Trouble concentrating 3 3 3   Moving slowly or fidgety/restless 3 3 1   Suicidal thoughts 3 0 0  PHQ-9 Score 27 24 22    GAD 7 : Generalized Anxiety Score 10/07/2016 09/09/2016 08/06/2016 01/29/2016  Nervous, Anxious, on Edge 3 3 3 3   Control/stop worrying 3 3 3 3   Worry too much - different things 3 3 3 3   Trouble relaxing 3 3 3 3   Restless 3  3 3 3   Easily annoyed or irritable 3 3 3 3   Afraid - awful might happen 3 3 3 3   Total GAD 7 Score 21 21 21 21     Assessment & Plan:  Felicia Medina was seen today for follow-up.  Diagnoses and all orders for this visit:  Abdominal bloating -     Hemoccult - 1 Card (office) -     ranitidine (ZANTAC) 150 MG tablet; Take 1 tablet (150 mg total) by mouth 2 (two) times daily.  Chronic anxiety -     diazepam (VALIUM) 10 MG tablet; Take 1 tablet (10 mg total) by mouth 2 (two) times daily. -     QUEtiapine (SEROQUEL) 50 MG tablet; Take 1 tablet (50 mg total) by mouth at bedtime. -     Lipid Panel -     TSH -     POCT glycosylated hemoglobin (Hb  A1C)  Depression, unspecified depression type -     diazepam (VALIUM) 10 MG tablet; Take 1 tablet (10 mg total) by mouth 2 (two) times daily. -     QUEtiapine (SEROQUEL) 50 MG tablet; Take 1 tablet (50 mg total) by mouth at bedtime. -     Lipid Panel -     TSH -     POCT glycosylated hemoglobin (Hb A1C)  Pure hypercholesterolemia -     pravastatin (PRAVACHOL) 20 MG tablet; Take 1 tablet (20 mg total) by mouth daily.   There are no diagnoses linked to this encounter.  No orders of the defined types were placed in this encounter.   Follow-up: Return in about 3 weeks (around 10/28/2016) for anxiety, depression and insomnia.   Boykin Nearing MD

## 2016-10-08 LAB — LIPID PANEL
CHOL/HDL RATIO: 4.1 ratio (ref 0.0–4.4)
CHOLESTEROL TOTAL: 189 mg/dL (ref 100–199)
HDL: 46 mg/dL (ref >39)
LDL Calculated: 102 mg/dL — ABNORMAL HIGH (ref 0–99)
Triglycerides: 203 mg/dL — ABNORMAL HIGH (ref 0–149)
VLDL Cholesterol Cal: 41 mg/dL — ABNORMAL HIGH (ref 5–40)

## 2016-10-08 LAB — TSH: TSH: 0.665 u[IU]/mL (ref 0.450–4.500)

## 2016-10-08 MED ORDER — PRAVASTATIN SODIUM 20 MG PO TABS: 20.0000 mg | ORAL_TABLET | Freq: Every day | ORAL | 3 refills | Status: AC

## 2016-10-08 NOTE — Assessment & Plan Note (Addendum)
A: chronic depression and and insomnia  with intolerance or no improvement with SSRI and SNRI P: Continue valium 10 mg BID Adding nightly Seroquel

## 2016-10-08 NOTE — Assessment & Plan Note (Signed)
A: chronic anxiety and and insomnia  with intolerance or no improvement with SSRI and SNRI P: Continue valium 10 mg BID Adding nightly Seroquel

## 2016-10-08 NOTE — Assessment & Plan Note (Signed)
  Cholesterol elevated recommended statin pravastatin 20 mg daily to start with recommended dose of 40 mg if tolerating well

## 2016-11-02 ENCOUNTER — Encounter: Payer: Self-pay | Admitting: Family Medicine

## 2016-11-02 ENCOUNTER — Ambulatory Visit: Payer: Medicaid Other | Attending: Family Medicine | Admitting: Family Medicine

## 2016-11-02 VITALS — BP 112/76 | HR 74 | Temp 97.8°F | Ht 63.0 in | Wt 156.4 lb

## 2016-11-02 DIAGNOSIS — F411 Generalized anxiety disorder: Secondary | ICD-10-CM | POA: Diagnosis not present

## 2016-11-02 DIAGNOSIS — G894 Chronic pain syndrome: Secondary | ICD-10-CM | POA: Diagnosis not present

## 2016-11-02 DIAGNOSIS — F419 Anxiety disorder, unspecified: Secondary | ICD-10-CM

## 2016-11-02 DIAGNOSIS — F17210 Nicotine dependence, cigarettes, uncomplicated: Secondary | ICD-10-CM | POA: Insufficient documentation

## 2016-11-02 DIAGNOSIS — Z79899 Other long term (current) drug therapy: Secondary | ICD-10-CM | POA: Diagnosis not present

## 2016-11-02 DIAGNOSIS — R062 Wheezing: Secondary | ICD-10-CM | POA: Diagnosis not present

## 2016-11-02 DIAGNOSIS — F32A Depression, unspecified: Secondary | ICD-10-CM

## 2016-11-02 DIAGNOSIS — M1990 Unspecified osteoarthritis, unspecified site: Secondary | ICD-10-CM | POA: Diagnosis not present

## 2016-11-02 DIAGNOSIS — F329 Major depressive disorder, single episode, unspecified: Secondary | ICD-10-CM | POA: Insufficient documentation

## 2016-11-02 DIAGNOSIS — R300 Dysuria: Secondary | ICD-10-CM | POA: Insufficient documentation

## 2016-11-02 MED ORDER — ACETAMINOPHEN ER 650 MG PO TBCR: 650.0000 mg | EXTENDED_RELEASE_TABLET | Freq: Three times a day (TID) | ORAL | 2 refills | Status: AC

## 2016-11-02 MED ORDER — QUETIAPINE FUMARATE 100 MG PO TABS: ORAL_TABLET | ORAL | 2 refills | Status: AC

## 2016-11-02 MED ORDER — CELECOXIB 100 MG PO CAPS: 100.0000 mg | ORAL_CAPSULE | Freq: Two times a day (BID) | ORAL | 2 refills | Status: AC

## 2016-11-02 NOTE — Patient Instructions (Addendum)
Felicia Medina was seen today for depression.  Diagnoses and all orders for this visit:  Dysuria -     POCT urinalysis dipstick  Chronic anxiety -     QUEtiapine (SEROQUEL) 100 MG tablet; Take by mouth 100 mg nightly for 3 nights, then 200 mg nightly for 3 nights, then 300 mg nightly  Depression, unspecified depression type -     QUEtiapine (SEROQUEL) 100 MG tablet; Take by mouth 100 mg nightly for 3 nights, then 200 mg nightly for 3 nights, then 300 mg nightly  Arthritis -     acetaminophen (TYLENOL 8 HOUR) 650 MG CR tablet; Take 1 tablet (650 mg total) by mouth every 8 (eight) hours. -     celecoxib (CELEBREX) 100 MG capsule; Take 1 capsule (100 mg total) by mouth 2 (two) times daily.  increase seroquel taper from 50 to 300 mg nightly by increasing every 3 nights Adding Celebrex and tylenol for pain control I will provide you a letter for ensure   F/u in 6 weeks for arthritis and insomnia   Dr. Adrian Blackwater

## 2016-11-02 NOTE — Progress Notes (Signed)
Subjective:  Patient ID: Felicia Medina, female    DOB: February 14, 1956  Age: 61 y.o. MRN: [id number]  CC: Depression   HPI MIRABELLE CYPHERS presents for   1. Anxiety: worsened 8 months ago following move to a new apartment and having issues with her neighbors. She reports feeling unsafe and anxious at home. Trouble sleeping due to loud music from her downstairs neighbor and yelling from her neighbor next door.  She reports depression and trouble sleeping. She reports crying often. She reports poor appetite. She request a letter of support to help her move to a different apartment. She also request a letter stating she needs ensure. She is smoking.   Medications: valium and seroquel. She denies excessive sedation and dizziness.   2. Arthritis and chronic pain: she reports diclofenac is not controlling pain. She has pain in hands, shoulders, neck. She has joint swelling in hands.  3. Dysuria: she has known non obstructive renal stones. Some low back pain. No fever, chills, pelvic pain. She has history of pansensitive E. Coli UTI in 6//2016 and 05/2015.   Social History  Substance Use Topics  . Smoking status: Light Tobacco Smoker    Packs/day: 0.33    Types: Cigarettes  . Smokeless tobacco: Never Used  . Alcohol use No   Outpatient Medications Prior to Visit  Medication Sig Dispense Refill  . albuterol (PROVENTIL HFA;VENTOLIN HFA) 108 (90 Base) MCG/ACT inhaler Inhale 2 puffs into the lungs every 6 (six) hours as needed for wheezing or shortness of breath. 1 Inhaler 3  . amLODipine (NORVASC) 10 MG tablet Take 1 tablet (10 mg total) by mouth daily. 90 tablet 3  . atorvastatin (LIPITOR) 20 MG tablet Take 1 tablet (20 mg total) by mouth daily. 90 tablet 3  . diazepam (VALIUM) 10 MG tablet Take 1 tablet (10 mg total) by mouth 2 (two) times daily. 60 tablet 3  . diclofenac (VOLTAREN) 75 MG EC tablet Take 1 tablet (75 mg total) by mouth 2 (two) times daily. 180 tablet 3  . hydrocortisone 2.5 %  cream Apply 1 application topically 2 (two) times daily. 30 g 11  . ibuprofen (ADVIL,MOTRIN) 600 MG tablet Take 1 tablet (600 mg total) by mouth 2 (two) times daily. 60 tablet 2  . lisinopril (PRINIVIL,ZESTRIL) 10 MG tablet Take 1 tablet (10 mg total) by mouth daily. 90 tablet 3  . pravastatin (PRAVACHOL) 20 MG tablet Take 1 tablet (20 mg total) by mouth daily. 90 tablet 3  . QUEtiapine (SEROQUEL) 50 MG tablet Take 1 tablet (50 mg total) by mouth at bedtime. 30 tablet 0  . ranitidine (ZANTAC) 150 MG tablet Take 1 tablet (150 mg total) by mouth 2 (two) times daily. 60 tablet 1   No facility-administered medications prior to visit.     ROS Review of Systems  Constitutional: Negative for chills and fever.  Eyes: Negative for visual disturbance.  Respiratory: Negative for shortness of breath.   Cardiovascular: Negative for chest pain.  Gastrointestinal: Negative for abdominal pain and blood in stool.  Genitourinary: Negative for frequency.  Musculoskeletal: Positive for arthralgias and joint swelling. Negative for back pain.  Skin: Negative for rash.  Allergic/Immunologic: Negative for immunocompromised state.  Hematological: Negative for adenopathy. Does not bruise/bleed easily.  Psychiatric/Behavioral: Positive for dysphoric mood and sleep disturbance. Negative for self-injury and suicidal ideas. The patient is nervous/anxious.     Objective:  BP 112/76   Pulse 74   Temp 97.8 F (36.6 C) (Oral)  Ht 5\' 3"  (1.6 m)   Wt 156 lb 6.4 oz (70.9 kg)   SpO2 96%   BMI 27.71 kg/m   BP/Weight 11/02/2016 10/07/2016 2/95/6213  Systolic BP 086 578 469  Diastolic BP 76 69 84  Wt. (Lbs) 156.4 156.6 153.2  BMI 27.71 27.74 27.14   Physical Exam  Constitutional: She is oriented to person, place, and time. She appears well-developed and well-nourished. No distress.  HENT:  Head: Normocephalic and atraumatic.  Mouth/Throat: She has dentures.  Cardiovascular: Normal rate, regular rhythm, normal  heart sounds and intact distal pulses.   Pulmonary/Chest: Effort normal and breath sounds normal. No respiratory distress. She has no wheezes.  Abdominal: Soft. Bowel sounds are normal. She exhibits no distension and no mass. There is no tenderness. There is CVA tenderness. There is no rebound and no guarding.  Musculoskeletal: She exhibits no edema.       Arms: Neurological: She is alert and oriented to person, place, and time.  Skin: Skin is warm and dry. No rash noted.  Psychiatric: She has a normal mood and affect. Her speech is normal and behavior is normal. Judgment and thought content normal. Cognition and memory are normal.   Lab Results  Component Value Date   HGBA1C 5.8 10/07/2016   UA: neg blood, trace LE, neg nitrites  Depression screen [facility] 2/9 11/02/2016 10/07/2016 09/09/2016  Decreased Interest 3 3 3   Down, Depressed, Hopeless 3 3 3   PHQ - 2 Score 6 6 6   Altered sleeping 3 3 3   Tired, decreased energy 3 3 3   Change in appetite 3 3 3   Feeling bad or failure about yourself  3 3 3   Trouble concentrating 3 3 3   Moving slowly or fidgety/restless 3 3 3   Suicidal thoughts 3 3 0  PHQ-9 Score 27 27 24   Some recent data might be hidden   GAD 7 : Generalized Anxiety Score 11/02/2016 10/07/2016 09/09/2016 08/06/2016  Nervous, Anxious, on Edge 3 3 3 3   Control/stop worrying 3 3 3 3   Worry too much - different things 3 3 3 3   Trouble relaxing 3 3 3 3   Restless 3 3 3 3   Easily annoyed or irritable 3 3 3 3   Afraid - awful might happen 3 3 3 3   Total GAD 7 Score 21 21 21 21     Assessment & Plan:  Alizza was seen today for depression.  Diagnoses and all orders for this visit:  Dysuria -     POCT urinalysis dipstick -     nitrofurantoin, macrocrystal-monohydrate, (MACROBID) 100 MG capsule; Take 1 capsule (100 mg total) by mouth 2 (two) times daily.  Chronic anxiety -     QUEtiapine (SEROQUEL) 100 MG tablet; Take by mouth 100 mg nightly for 3 nights, then 200 mg nightly for 3  nights, then 300 mg nightly  Depression, unspecified depression type -     QUEtiapine (SEROQUEL) 100 MG tablet; Take by mouth 100 mg nightly for 3 nights, then 200 mg nightly for 3 nights, then 300 mg nightly  Arthritis -     acetaminophen (TYLENOL 8 HOUR) 650 MG CR tablet; Take 1 tablet (650 mg total) by mouth every 8 (eight) hours. -     celecoxib (CELEBREX) 100 MG capsule; Take 1 capsule (100 mg total) by mouth 2 (two) times daily.  Chronic pain syndrome   There are no diagnoses linked to this encounter.  No orders of the defined types were placed in this encounter.  Follow-up: Return in about 6 weeks (around 12/14/2016) for arthritis and insomnia.   Boykin Nearing MD

## 2016-11-04 LAB — POCT URINALYSIS DIPSTICK
BILIRUBIN UA: NEGATIVE
Glucose, UA: NEGATIVE
KETONES UA: NEGATIVE
Nitrite, UA: NEGATIVE
PH UA: 5.5 (ref 5.0–8.0)
PROTEIN UA: NEGATIVE
RBC UA: NEGATIVE
SPEC GRAV UA: 1.025 (ref 1.010–1.025)
Urobilinogen, UA: 0.2 E.U./dL

## 2016-11-04 MED ORDER — NITROFURANTOIN MONOHYD MACRO 100 MG PO CAPS: 100.0000 mg | ORAL_CAPSULE | Freq: Two times a day (BID) | ORAL | 0 refills | Status: AC

## 2016-11-04 NOTE — Assessment & Plan Note (Signed)
Trace LE with dysuria with flank pain No fever, chills, nausea or abdominal pain Pan sensitive E. Coli in the past

## 2016-11-04 NOTE — Assessment & Plan Note (Signed)
Continue valium

## 2016-11-04 NOTE — Assessment & Plan Note (Signed)
Taper up on seroquel for depression with insomnia  Patient has tried and failed celexa and trazodone

## 2016-11-04 NOTE — Assessment & Plan Note (Signed)
Scheduled tylenol and celebrex

## 2016-12-17 ENCOUNTER — Other Ambulatory Visit: Payer: Self-pay | Admitting: Family Medicine

## 2016-12-17 DIAGNOSIS — R140 Abdominal distension (gaseous): Secondary | ICD-10-CM

## 2016-12-25 DEATH — deceased

## 2017-01-04 ENCOUNTER — Ambulatory Visit: Payer: Medicaid Other | Admitting: Internal Medicine

## 2019-02-11 IMAGING — CT CT RENAL STONE PROTOCOL
2 of 4 series · 16 of 46 positions shown, 18 images · non-contrast
Comparison: None.

CLINICAL DATA: Acute onset of hematuria and proteinuria. Initial
encounter.

EXAM:
CT ABDOMEN AND PELVIS WITHOUT CONTRAST
TECHNIQUE: Multidetector CT imaging of the abdomen and pelvis was performed
following the standard protocol without IV contrast.

[Series 3: stone study 5.0 i30f 2 · axial · 0.89mm/px · z∈[-423,-18]mm · 13 of 89 slices shown, 15 images]
[im 4/89  soft-tissue]
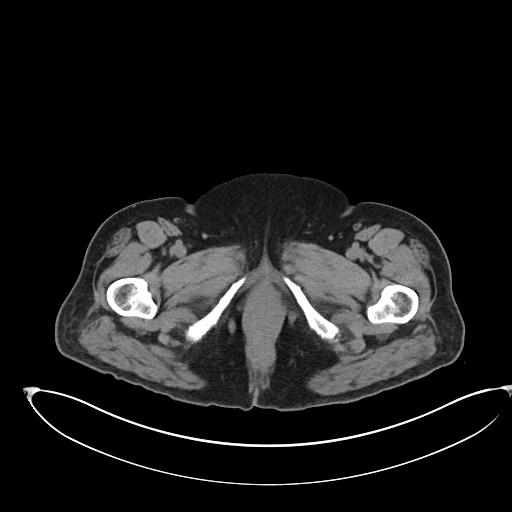
[im 4/89  bone]
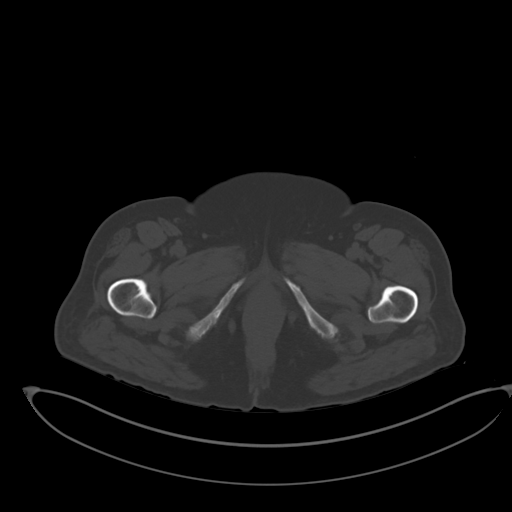
[im 11/89  soft-tissue]
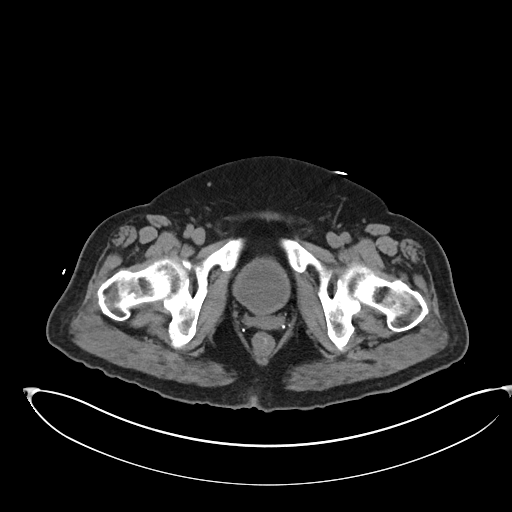
[im 18/89  soft-tissue]
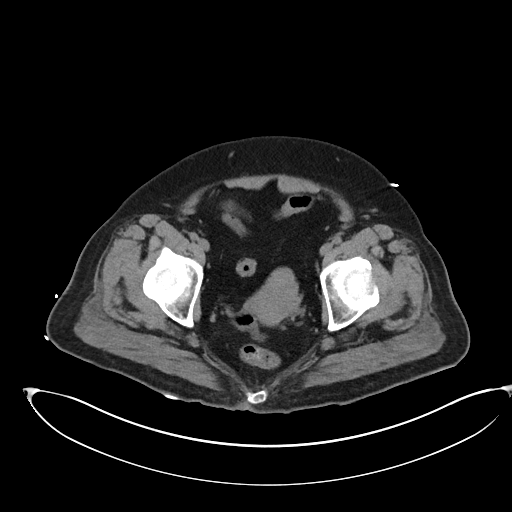
[im 25/89  soft-tissue]
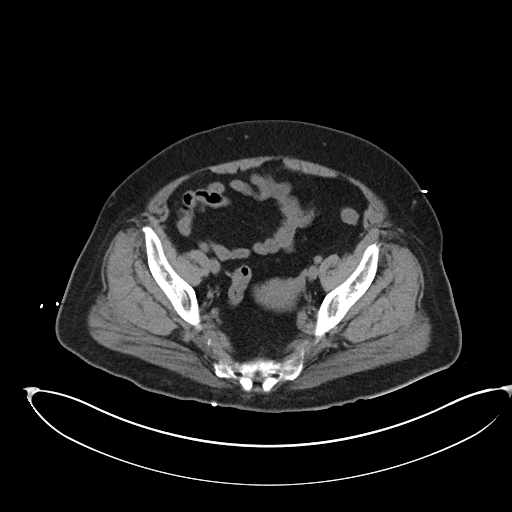
[im 32/89  soft-tissue]
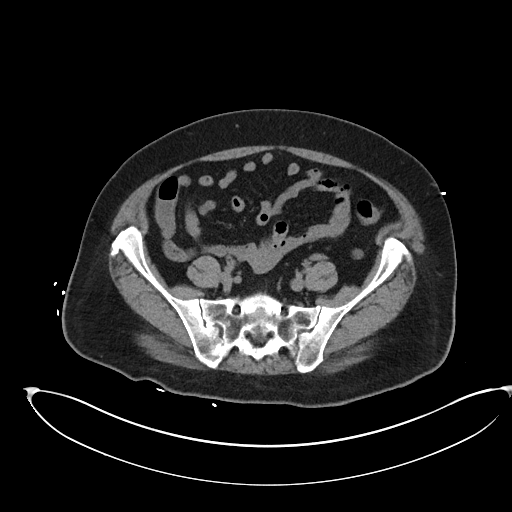
[im 39/89  soft-tissue]
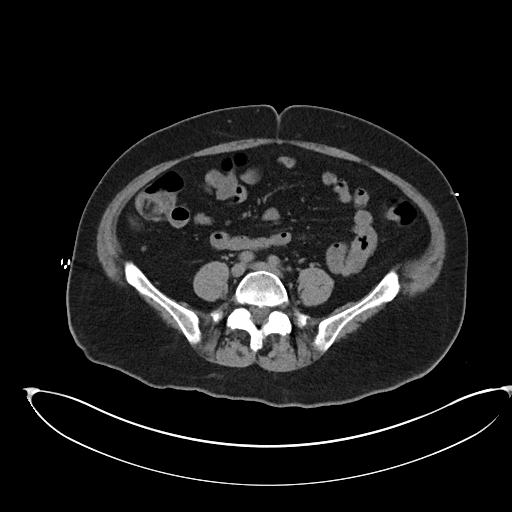
[im 46/89  soft-tissue]
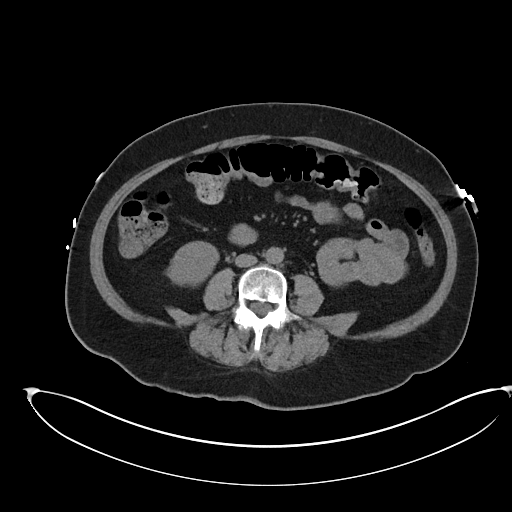
[im 50/89  soft-tissue]
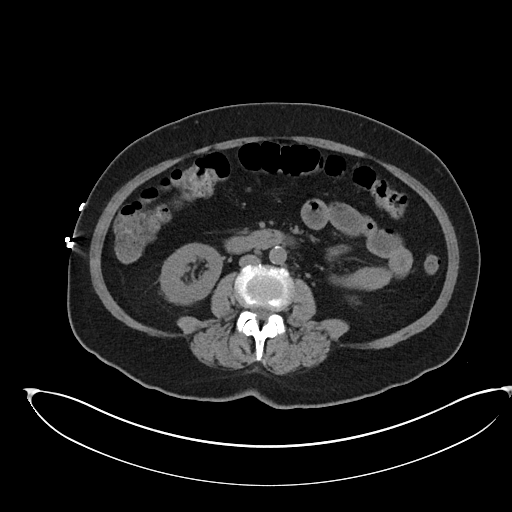
[im 57/89  soft-tissue]
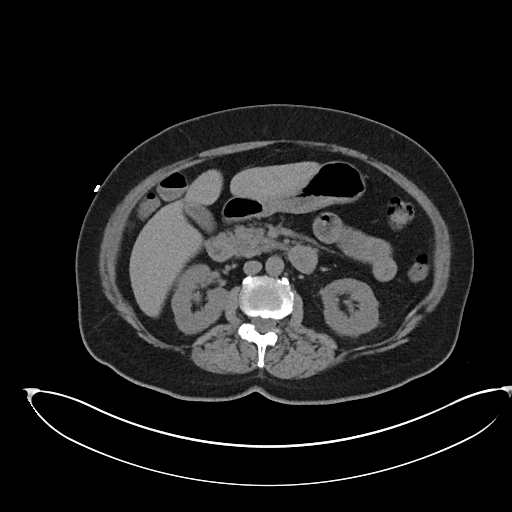
[im 57/89  bone]
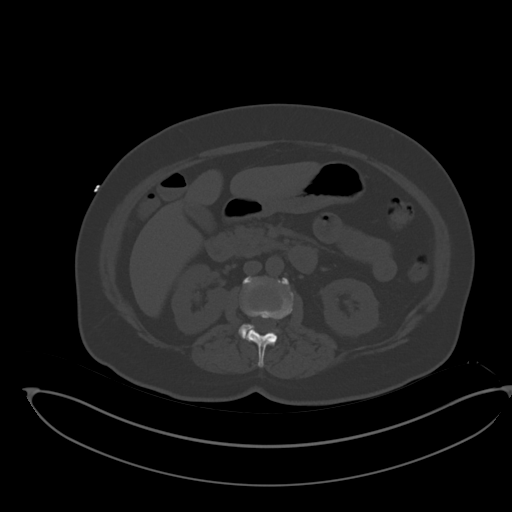
[im 64/89  soft-tissue]
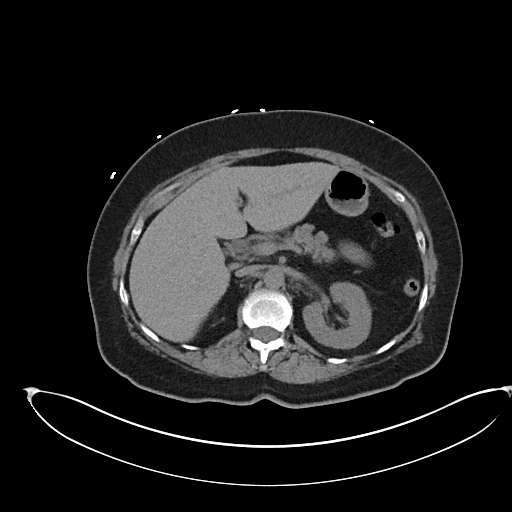
[im 71/89  soft-tissue]
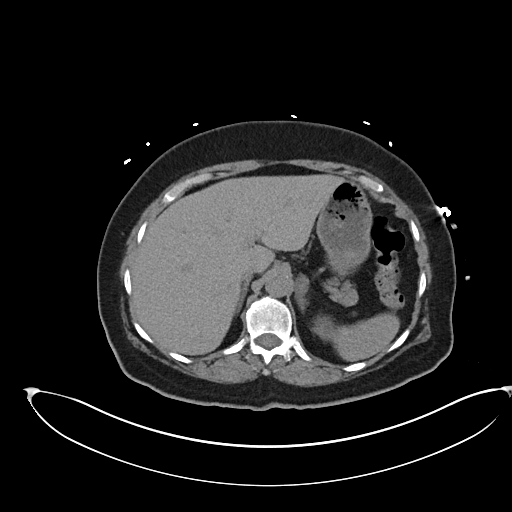
[im 78/89  soft-tissue]
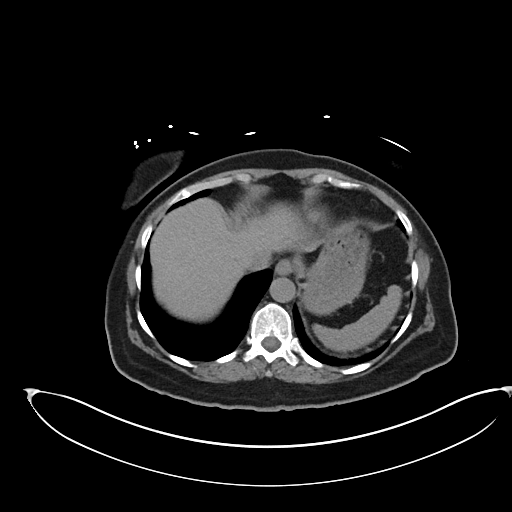
[im 85/89  soft-tissue]
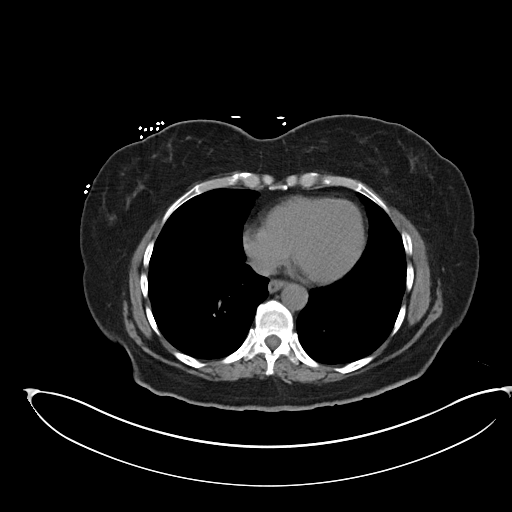

[Series 6: coronal soft tissue · coronal · 0.85mm/px · 3 of 106 slices shown]
[im 36/106  soft-tissue]
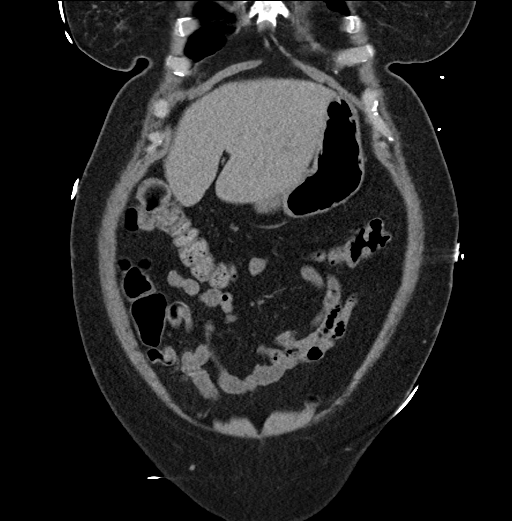
[im 47/106  soft-tissue]
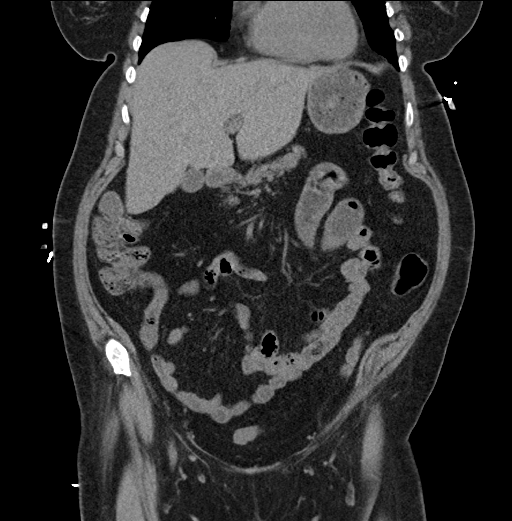
[im 59/106  soft-tissue]
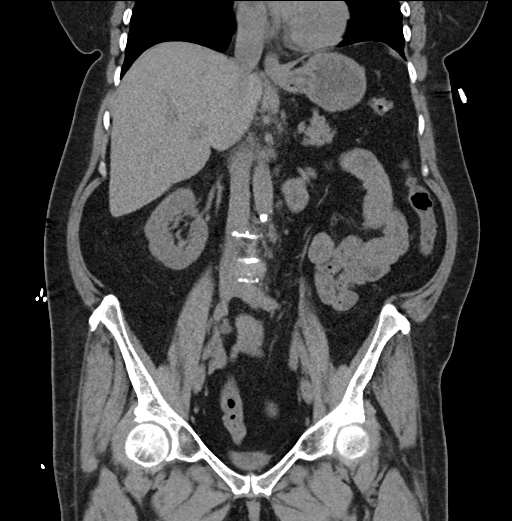

[16 of 46 positions shown; findings below may reference images not displayed]

FINDINGS: Lower chest: The visualized lung bases are grossly clear. The
visualized portions of the mediastinum are unremarkable.

Hepatobiliary: The liver is unremarkable in appearance. The
gallbladder is unremarkable in appearance. The common bile duct
remains normal in caliber.

Pancreas: The pancreas is within normal limits.

Spleen: The spleen is unremarkable in appearance.

Adrenals/Urinary Tract: There is asymmetric prominence of the left
adrenal gland. This may reflect an underlying nodule. The right
adrenal gland is unremarkable in appearance.

Scattered bilateral nonobstructing renal stones are seen, including
a 1.1 cm stone at the left renal pelvis. No obstructing ureteral
stones are identified. There is no evidence of hydronephrosis.
Nonspecific perinephric stranding is noted bilaterally.

Stomach/Bowel: The stomach is unremarkable in appearance. The small
bowel is within normal limits. The appendix is normal in caliber,
without evidence of appendicitis. The colon is unremarkable in
appearance.

Vascular/Lymphatic: Scattered calcification is seen along the
abdominal aorta and its branches. The abdominal aorta is otherwise
grossly unremarkable. The inferior vena cava is grossly
unremarkable. No retroperitoneal lymphadenopathy is seen. No pelvic
sidewall lymphadenopathy is identified.

Reproductive: The bladder is mildly distended and grossly
unremarkable. The uterus is unremarkable in appearance. The ovaries
are relatively symmetric. No suspicious adnexal masses are seen.

Other: No additional soft tissue abnormalities are seen.

Musculoskeletal: No acute osseous abnormalities are identified. The
visualized musculature is unremarkable in appearance.
IMPRESSION: 1. No evidence of hydronephrosis.
2. Nonobstructing bilateral renal stones, including a 1.1 cm stone
at the left renal pelvis.
3. Scattered aortic atherosclerosis.
4. Asymmetric prominence of the left adrenal gland. This may reflect
an underlying nodule. Would correlate with adrenal labs, and
consider adrenal protocol MRI or CT for further evaluation, as
deemed clinically appropriate.
